# Patient Record
Sex: Male | Born: 1970 | Race: White | Hispanic: No | Marital: Married | Smoking: Former smoker
Health system: Southern US, Community
[De-identification: ages and names within clinical notes are randomized; demographics above are authoritative.]

## PROBLEM LIST (undated history)

## (undated) DIAGNOSIS — G4730 Sleep apnea, unspecified: Secondary | ICD-10-CM

## (undated) DIAGNOSIS — R011 Cardiac murmur, unspecified: Secondary | ICD-10-CM

## (undated) DIAGNOSIS — F32A Depression, unspecified: Secondary | ICD-10-CM

## (undated) DIAGNOSIS — I10 Essential (primary) hypertension: Secondary | ICD-10-CM

## (undated) DIAGNOSIS — M1990 Unspecified osteoarthritis, unspecified site: Secondary | ICD-10-CM

## (undated) HISTORY — PX: VARICOSE VEIN SURGERY: SHX832

## (undated) HISTORY — PX: EXTERNAL EAR SURGERY: SHX627

---

## 2005-09-20 ENCOUNTER — Encounter (INDEPENDENT_AMBULATORY_CARE_PROVIDER_SITE_OTHER): Payer: Self-pay | Admitting: *Deleted

## 2005-09-20 ENCOUNTER — Ambulatory Visit (HOSPITAL_COMMUNITY): Admission: RE | Admit: 2005-09-20 | Discharge: 2005-09-20 | Payer: Self-pay | Admitting: Otolaryngology

## 2008-05-23 ENCOUNTER — Ambulatory Visit (HOSPITAL_BASED_OUTPATIENT_CLINIC_OR_DEPARTMENT_OTHER): Admission: RE | Admit: 2008-05-23 | Discharge: 2008-05-23 | Payer: Self-pay | Admitting: Otolaryngology

## 2008-05-29 ENCOUNTER — Encounter: Payer: Self-pay | Admitting: Internal Medicine

## 2008-06-06 ENCOUNTER — Ambulatory Visit: Payer: Self-pay | Admitting: Internal Medicine

## 2008-06-16 ENCOUNTER — Encounter: Payer: Self-pay | Admitting: Internal Medicine

## 2008-07-08 ENCOUNTER — Ambulatory Visit: Payer: Self-pay | Admitting: Internal Medicine

## 2008-07-08 DIAGNOSIS — G4730 Sleep apnea, unspecified: Secondary | ICD-10-CM | POA: Insufficient documentation

## 2008-07-10 ENCOUNTER — Encounter: Payer: Self-pay | Admitting: Internal Medicine

## 2008-08-13 ENCOUNTER — Ambulatory Visit: Payer: Self-pay | Admitting: Internal Medicine

## 2010-10-28 NOTE — H&P (Signed)
NAMEMARCELLO, [name]                 ACCOUNT NO.:  [id number]   MEDICAL RECORD NO.:  [id number]          PATIENT TYPE:  AMB   LOCATION:  SDS                          FACILITY:  [facility]   PHYSICIAN:  Hermelinda Medicus, M.D.   DATE OF BIRTH:  Oct 05, 1970   DATE OF ADMISSION:  09/20/2005  DATE OF DISCHARGE:                                HISTORY & PHYSICAL   HISTORY OF PRESENT ILLNESS:  This patient is a 40 year old male who has had  ear issues, ear problems with infection and otitis externa and otitis media  for several years.  He saw me several years ago, and then came in just  recently, having first been seen with a very small area of infection in  2000.  He had a right otitis externa, but he improved on Augmentin and  Cipro.  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  He  has had persistent infections, and has seen the medical doctor recently, but  needs to have a right mastoid tympanoplasty.  He weighs 398, and he had a  complete evaluation by Dr. Lady Deutscher of [facility] and found  to be in excellent health.  His weight in high school was 285.  He, at one  time, weighed 450, and he weighs now 395.  His other ear, his left, is  clear.  Tympanic membrane looks excellent.  His oral cavity and nose are  clear.  His larynx is clear.  True cords, false cords, epiglottis, base of  tongue are free of any ulceration or mass.  His neck is free of any  thyromegaly, cervical adenopathy or mass.  His right ear just shows the  posterosuperior attic cholesteatoma.  His audiogram is quite reasonable,  showing a conductive hearing loss at the lower frequency and a mild  sensorineural hearing loss in the upper frequency, with some conductive loss  with a flat tympanogram on that right side.  He has 100% discrimination  score, and he has a 30 decibel speech reception threshold.  His chest is  clear.   No rales, rhonchi or wheezes.  Cardiovascular exam revealed no rubs,  murmurs or gallops.  Abdomen was unremarkable, except that he is extremely  obese at 395, and his legs are clear.  He has had vein strippings in the  past.   DIAGNOSIS:  1.  Right mastoiditis with cholesteatoma, tympanic membrane perforation,      superior posterior pars flaccida.  2.  Exogenous obesity.  3.  History of smoking, quit in 1994.  4.  Cardiac clearance within normal limits.  5.  History of gout.  6.  History of vein stripping.           ______________________________  Hermelinda Medicus, M.D.     JC/MEDQ  D:  09/20/2005  T:  09/20/2005  Job:  [id number]   cc:   [facility]  [place], [place]   Elmore Guise., M.D.  Fax: [phone]

## 2010-10-28 NOTE — Op Note (Signed)
[name], [name]                 ACCOUNT NO.:  [id number]   MEDICAL RECORD NO.:  [id number]          PATIENT TYPE:  AMB   LOCATION:  SDS                          FACILITY:  [facility]   PHYSICIAN:  Hermelinda Medicus, M.D.   DATE OF BIRTH:  April 06, 1971   DATE OF PROCEDURE:  DATE OF DISCHARGE:                                 OPERATIVE REPORT   PREOPERATIVE DIAGNOSES:  Right mastoiditis with attic cholesteatoma  posterior superior pars flaccida.   POSTOPERATIVE DIAGNOSES:  Right mastoiditis with attic cholesteatoma  posterior superior pars flaccida.   OPERATION:  Right mastoid tympanoplasty canal wall down.   SURGEON:  Dr. Hermelinda Medicus.   ANESTHESIA:  Dr. Burna Forts, general endotracheal.   PROCEDURE:  The patient was placed in the supine position. Under general  endotracheal anesthesia, the ear was prepped and draped using Betadine x3  and the usual head drape.  The prep was Betadine expressly in the external  ear canal and then the otologic drape was placed and the first approach was  via the external ear canal where we made a tympanotomy incision after  injecting the 4 quadrants with 1% Xylocaine with epinephrine. A considerable  amount of cholesteatomatous debris was removed from the sac immediately and  then the sac was separated from the good portion of the tympanic membrane  which represented about 70% of the tympanic membrane that we could save. The  superior posterior aspect however was including the head and long process of  the incus, posterior canal wall, the head of the malleus even anterior to  the malleus. This area was all taken and was reflected superiorly and a  considerable amount was removed just from this external ear canal exposure.  Once we exposed and removed as much as we could see through the external ear  canal, we then did a postauricular incision, took a temporalis fascia graft  and retracted the mastoid soft tissues anteriorly and posteriorly,  isolated  the external ear canal and we could see right down the ear canal. As we  began the mastoid, we realized that the superior aspect of the mastoid was  really quite narrow and the shadow of the middle cranial fossa dura could be  seen very close to the superior aspect of the ear canal.  We therefore took  down the posterior and superior canal as we worked down toward this quite  large cholesteatoma that extended up to be anterior to the head of the of  the malleus and involved the head of the malleus and the incus but did not  go into the facial recess region. We could see the facial nerve but we did  not need to take down the incus or remove the incus. All the ossicular chain  was intact.  The chorda tympani was also intact and we saved that. We  removed this cholesteatoma from the heads of the ossicular chain and from  this posterior aspect and anterior and were able to once we took down this  posterior and anterior superior canal wall, we could see the entire attic  and antrum region.  Once this was completed and all cholesteatoma was  removed, the remainder of the mastoid looked to be in quite good condition.  There were some cells with fluid but most of this was very sclerotic  mastoid. We then took the temporalis fascia graft, extended this underneath  the superior edge of the tympanic membrane and draped it up over the heads  of the malleus and up along the superior and posterior area of the new  enlarged canal and mastoid. Once this was achieved, we placed Gelfoam on  this region and then closed our mastoid dressing with a double layer of  chromic catgut and skin clips.  We then via the external ear canal placed  more Gelfoam, then  Neosporin ointment, then cotton, then a mastoid dressing and the patient was  taken to the recovery room in good condition.  The patient will be an  outpatient and we will see him again on Friday in 3 days for dressing  removal or change and then  in 1 week, 3 weeks, 6 weeks 3 months, 6 months  and a year.           ______________________________  Hermelinda Medicus, M.D.     JC/MEDQ  D:  09/20/2005  T:  09/20/2005  Job:  [id number]   cc:   [facility], [place], [place]   Elmore Guise., M.D.  Fax: [phone]

## 2010-10-28 NOTE — Procedures (Signed)
NAMEJAMYSON, [name]                 ACCOUNT NO.:  [id number]   MEDICAL RECORD NO.:  [id number]          PATIENT TYPE:  OUT   LOCATION:  SLEEP CENTER                 FACILITY:  [facility]   PHYSICIAN:  Clinton D. Maple Hudson, MD, FCCP, FACPDATE OF BIRTH:  Feb 19, 1969   DATE OF STUDY:                            NOCTURNAL POLYSOMNOGRAM   REFERRING PHYSICIAN:  Hermelinda Medicus, M.D.   INDICATION FOR STUDY:  Hypersomnia with sleep apnea.   EPWORTH SLEEPINESS SCORE:  Epworth sleepiness score 6/24.  BMI 53.6.  Weight 395 pounds.  Height 72 inches.  Neck 21 inches.   MEDICATIONS:  No home medications were listed.   SLEEP ARCHITECTURE:  Split study protocol triggered by emergency center  protocol.  During the diagnostic phase total sleep time 134.5 minutes  with sleep efficiency 83%.  Stage I was 0.4%.  Stage II 94.8%.  Stage  III absent.  REM 4.8% of total sleep time.  Sleep latency 10.5 minutes.  REM latency 128.5 minutes.  Awake after sleep onset 0.5 minutes.  Arousal index 7.6.  No bedtime medication was taken.   RESPIRATORY DATA:  Split study protocol.  During the initial diagnostic  phase, a total of 268 events was recorded including 1 obstructive apnea  and 267 hypopneas.  All events were recorded as nonsupine.  REM AHI  73.8.  CPAP was initiated by the technician per protocol because of  severe apnea with desaturation and titrated to 23 CWP, AHI 0 per hour.  He chose a medium ResMed full face Quattro mask with heated humidifier.   OXYGEN DATA:  Very loud snoring before CPAP with oxygen desaturation to  a nadir of 60% and mean oxygen saturation before CPAP of 84.8% on room  air.  After CPAP control, mean oxygen saturation held 93.3% on room air.   CARDIAC DATA:  Normal sinus rhythm.   MOVEMENT-PARASOMNIA:  No significant movement disturbance.  Bathroom x1.   IMPRESSIONS-RECOMMENDATIONS:  1. Severe obstructive sleep apnea/hypopnea syndrome, apnea-hypopnea      index 119.6 per hour with most  events hypopneas recorded while      supine.  Very loud snoring with oxygen desaturation with to a nadir      of 60% on room air.  2. Following laboratory protocol for severe apnea, CPAP was initiated      and titrated to 23 CWP, AHI 0 per hour.      The patient chose a medium ResMed full face Quattro mask with      heated humidifier.  3. Other treatments may be appropriate depending on clinical context.      Clinton D. Maple Hudson, MD, [facility], FACP  Diplomate, Biomedical engineer of Sleep Medicine  Electronically Signed     CDY/MEDQ  D:  05/30/2008 11:55:49  T:  05/31/2008 16:10:96  Job:  [id number]

## 2018-07-31 ENCOUNTER — Other Ambulatory Visit: Payer: Self-pay

## 2018-07-31 DIAGNOSIS — I83893 Varicose veins of bilateral lower extremities with other complications: Secondary | ICD-10-CM

## 2018-08-05 ENCOUNTER — Other Ambulatory Visit: Payer: Self-pay

## 2018-08-05 ENCOUNTER — Encounter: Payer: Self-pay | Admitting: Surgery

## 2018-08-05 ENCOUNTER — Ambulatory Visit (HOSPITAL_COMMUNITY)
Admission: RE | Admit: 2018-08-05 | Discharge: 2018-08-05 | Disposition: A | Discharge status: Home / Self Care | Payer: 59 | Source: Ambulatory Visit | Attending: Family | Admitting: Family

## 2018-08-05 ENCOUNTER — Ambulatory Visit (INDEPENDENT_AMBULATORY_CARE_PROVIDER_SITE_OTHER): Payer: 59 | Admitting: Surgery

## 2018-08-05 VITALS — BP 121/82 | HR 50 | Temp 98.0°F | Resp 20 | Ht 72.0 in | Wt 301.1 lb

## 2018-08-05 DIAGNOSIS — I83893 Varicose veins of bilateral lower extremities with other complications: Secondary | ICD-10-CM | POA: Diagnosis present

## 2018-08-05 NOTE — Progress Notes (Signed)
[facility] [place]  Patient name: [name] MRN: [id number] DOB: 21-Nov-1970 Sex: male   REQUESTING PROVIDER:    Cletis Athens   REASON FOR CONSULT:    Varicose veins  HISTORY OF PRESENT ILLNESS:   [name] is a 48 y.o. male, who is referred today for evaluation of bilateral leg varicose veins.  The patient states that approximately 20 years ago he underwent left leg vein stripping by Dr. Guinevere Scarlet.  He has been wearing knee-high compression stockings daily.  About 3 weeks ago he developed severe pain and swelling over 1 of the varicosities in his left upper thigh.  He was treated with antibiotics.  This has significantly improved.  Patient does have a history of a ulcer on his right leg which healed.  He states that he has noticed skin color changes around both ankles for many years.  He denies any history of DVT.  PAST MEDICAL HISTORY    Thrombophlebitis   FAMILY HISTORY   Negative for DVT  SOCIAL HISTORY:   Social History   Socioeconomic History  . Marital status: Married    Spouse name: Not on file  . Number of children: Not on file  . Years of education: Not on file  . Highest education level: Not on file  Occupational History  . Not on file  Social Needs  . Financial resource strain: Not on file  . Food insecurity:    Worry: Not on file    Inability: Not on file  . Transportation needs:    Medical: Not on file    Non-medical: Not on file  Tobacco Use  . Smoking status: Never Smoker  . Smokeless tobacco: Current User    Types: Chew  Substance and Sexual Activity  . Alcohol use: Yes    Comment: occasional  . Drug use: Not Currently  . Sexual activity: Not on file  Lifestyle  . Physical activity:    Days per week: Not on file    Minutes per session: Not on file  . Stress: Not on file  Relationships  . Social connections:    Talks on phone: Not on file    Gets together: Not on file   Attends religious service: Not on file    Active member of club or organization: Not on file    Attends meetings of clubs or organizations: Not on file    Relationship status: Not on file  . Intimate partner violence:    Fear of current or ex partner: Not on file    Emotionally abused: Not on file    Physically abused: Not on file    Forced sexual activity: Not on file  Other Topics Concern  . Not on file  Social History Narrative  . Not on file    ALLERGIES:    No Known Allergies  CURRENT MEDICATIONS:    No current outpatient medications on file.   No current facility-administered medications for this visit.     REVIEW OF SYSTEMS:    denotes positive finding,  denotes negative finding Cardiac  Comments:  Chest pain or chest pressure:    Shortness of breath upon exertion:    Short of breath when lying flat:    Irregular heart rhythm:        Vascular    Pain in calf, thigh, or hip brought on by ambulation: x   Pain in feet at night that wakes you up from your sleep:  Blood clot in your veins:    Leg swelling:  x       Pulmonary    Oxygen at home:    Productive cough:     Wheezing:         Neurologic    Sudden weakness in arms or legs:     Sudden numbness in arms or legs:     Sudden onset of difficulty speaking or slurred speech:    Temporary loss of vision in one eye:     Problems with dizziness:         Gastrointestinal    Blood in stool:      Vomited blood:         Genitourinary    Burning when urinating:     Blood in urine:        Psychiatric    Major depression:         Hematologic    Bleeding problems:    Problems with blood clotting too easily:        Skin    Rashes or ulcers:        Constitutional    Fever or chills:     PHYSICAL EXAM:   Vitals:   08/05/18 1008  BP: 121/82  Pulse: (!) 50  Resp: 20  Temp: 98 F (36.7 C)  SpO2: 100%  Weight: (!) 301 lb 2.4 oz (136.6 kg)  Height: 6' (1.829 m)    GENERAL: The  patient is a well-nourished male, in no acute distress. The vital signs are documented above. CARDIAC: There is a regular rate and rhythm.  VASCULAR: Extensive ropelike varicosities bilaterally.  The area of concern on the anterior left thigh appears to be an area of thrombophlebitis.  There is no longer any inflammation or erythema to suggest infection. PULMONARY: Nonlabored respirations ABDOMEN: Soft and non-tender with normal pitched bowel sounds.  MUSCULOSKELETAL: There are no major deformities or cyanosis. NEUROLOGIC: No focal weakness or paresthesias are detected. SKIN: Hyperpigmentation bilateral lower extremity with evidence of healed ulcers bilaterally PSYCHIATRIC: The patient has a normal affect.  STUDIES:   I have ordered and reviewed his vascular studies with the following findings:    Venous Reflux Times Normal value < 0.5 sec +------------------------------+----------+---------+                               Right (ms)Left (ms) +------------------------------+----------+---------+ CFV                           3789.00   1973.00   +------------------------------+----------+---------+ FV                            3308.00   1276.00   +------------------------------+----------+---------+ Popliteal                     1027.00   1078.00   +------------------------------+----------+---------+ GSV at Saphenofemoral junction3675.00   2105.00   +------------------------------+----------+---------+ GSV prox thigh                3367.00             +------------------------------+----------+---------+ GSV mid thigh                 2993.00             +------------------------------+----------+---------+ GSV dist thigh  3763.00             +------------------------------+----------+---------+ GSV at knee                   2919.00             +------------------------------+----------+---------+ GSV prox calf                  2288.00             +------------------------------+----------+---------+ GSV mid calf                  3029.00             +------------------------------+----------+---------+ SSV prox                                3777.00   +------------------------------+----------+---------+ SSV mid                                 4423.00   +------------------------------+----------+---------+  Vein Diameters: +------------------------------+----------+---------+                               Right (cm)Left (cm) +------------------------------+----------+---------+ GSV at Saphenofemoral junction1.41      1.20      +------------------------------+----------+---------+ GSV at prox thigh             1.15      -         +------------------------------+----------+---------+ GSV at mid thigh              1.13      -         +------------------------------+----------+---------+ GSV at distal thigh           .89       -         +------------------------------+----------+---------+ GSV at knee                   .97       -         +------------------------------+----------+---------+ GSV prox calf                 1.27      -         +------------------------------+----------+---------+ GSV mid calf                  .52       -         +------------------------------+----------+---------+ SSV origin                    .33       .23       +------------------------------+----------+---------+ SSV prox                      .36       thrombus  +------------------------------+----------+---------+ SSV mid                       .59       .27       +------------------------------+----------+---------+      ASSESSMENT and PLAN   Extensive bilateral venous reflux with varicosities.  The patient has had what sounds like an episode of thrombophlebitis on his left thigh.  This has significantly improved.  It is  almost resolved.  I have encouraged him  to use warm compresses and ibuprofen to help with his symptoms.  Today's ultrasound shows no evidence of a left saphenous vein consistent with his prior vein stripping.  He has significant reflux in the right saphenous vein.  The patient is not concerned about any cosmetic issues.  He does wear his compression stockings religiously.  I told him that he would be a candidate for laser ablation of the right leg as well as stab phlebectomy of his numerous bilateral varicosities if he so desires.  At this time, he is content to just wear his compression stockings.  He will contact us if this changes.   Durene Cal, MD [facility] [phone] Pager [phone]

## 2018-08-07 ENCOUNTER — Encounter: Payer: Self-pay | Admitting: Nurse Practitioner

## 2021-01-02 ENCOUNTER — Emergency Department (HOSPITAL_COMMUNITY): Payer: Self-pay

## 2021-01-02 ENCOUNTER — Encounter (HOSPITAL_COMMUNITY): Payer: Self-pay | Admitting: Emergency Medicine

## 2021-01-02 ENCOUNTER — Other Ambulatory Visit: Payer: Self-pay

## 2021-01-02 ENCOUNTER — Emergency Department (HOSPITAL_COMMUNITY)
Admission: EM | Admit: 2021-01-02 | Discharge: 2021-01-02 | Disposition: A | Discharge status: Home / Self Care | Payer: Self-pay | Attending: Emergency Medicine | Admitting: Emergency Medicine

## 2021-01-02 DIAGNOSIS — R202 Paresthesia of skin: Secondary | ICD-10-CM | POA: Insufficient documentation

## 2021-01-02 DIAGNOSIS — S3992XA Unspecified injury of lower back, initial encounter: Secondary | ICD-10-CM | POA: Insufficient documentation

## 2021-01-02 DIAGNOSIS — I10 Essential (primary) hypertension: Secondary | ICD-10-CM | POA: Insufficient documentation

## 2021-01-02 DIAGNOSIS — F17220 Nicotine dependence, chewing tobacco, uncomplicated: Secondary | ICD-10-CM | POA: Insufficient documentation

## 2021-01-02 DIAGNOSIS — W1789XA Other fall from one level to another, initial encounter: Secondary | ICD-10-CM | POA: Insufficient documentation

## 2021-01-02 HISTORY — DX: Essential (primary) hypertension: I10

## 2021-01-02 LAB — COMPREHENSIVE METABOLIC PANEL
ALT: 32 U/L (ref 0–44)
AST: 34 U/L (ref 15–41)
Albumin: 4.7 g/dL (ref 3.5–5.0)
Alkaline Phosphatase: 49 U/L (ref 38–126)
Anion gap: 10 (ref 5–15)
BUN: 19 mg/dL (ref 6–20)
CO2: 26 mmol/L (ref 22–32)
Calcium: 9.7 mg/dL (ref 8.9–10.3)
Chloride: 99 mmol/L (ref 98–111)
Creatinine, Ser: 0.77 mg/dL (ref 0.61–1.24)
GFR, Estimated: >60 mL/min (ref >60)
Glucose, Bld: 94 mg/dL (ref 70–99)
Potassium: 4.5 mmol/L (ref 3.5–5.1)
Sodium: 135 mmol/L (ref 135–145)
Total Bilirubin: 1.1 mg/dL (ref 0.3–1.2)
Total Protein: 7.8 g/dL (ref 6.5–8.1)

## 2021-01-02 LAB — CBC WITH DIFFERENTIAL/PLATELET
Abs Immature Granulocytes: 0.04 10*3/uL (ref 0.00–0.07)
Basophils Absolute: 0 10*3/uL (ref 0.0–0.1)
Basophils Relative: 0 %
Eosinophils Absolute: 0 10*3/uL (ref 0.0–0.5)
Eosinophils Relative: 0 %
HCT: 49.6 % (ref 39.0–52.0)
Hemoglobin: 16.8 g/dL (ref 13.0–17.0)
Immature Granulocytes: 0 %
Lymphocytes Relative: 16 %
Lymphs Abs: 1.7 10*3/uL (ref 0.7–4.0)
MCH: 32.2 pg (ref 26.0–34.0)
MCHC: 33.9 g/dL (ref 30.0–36.0)
MCV: 95.2 fL (ref 80.0–100.0)
Monocytes Absolute: 0.6 10*3/uL (ref 0.1–1.0)
Monocytes Relative: 6 %
Neutro Abs: 8.1 10*3/uL — ABNORMAL HIGH (ref 1.7–7.7)
Neutrophils Relative %: 78 %
Platelets: 198 10*3/uL (ref 150–400)
RBC: 5.21 MIL/uL (ref 4.22–5.81)
RDW: 13.7 % (ref 11.5–15.5)
WBC: 10.5 10*3/uL (ref 4.0–10.5)
nRBC: 0 % (ref 0.0–0.2)

## 2021-01-02 MED ORDER — OXYCODONE-ACETAMINOPHEN 5-325 MG PO TABS: 2.0000 | ORAL_TABLET | Freq: Four times a day (QID) | ORAL | 0 refills | Status: AC | PRN

## 2021-01-02 MED ORDER — FENTANYL CITRATE (PF) 100 MCG/2ML IJ SOLN
100.0000 ug | Freq: Once | INTRAMUSCULAR | Status: AC
  Administered 2021-01-02: 100 ug via INTRAVENOUS
  Filled 2021-01-02: qty 2

## 2021-01-02 MED ORDER — HYDROMORPHONE HCL 1 MG/ML IJ SOLN
1.0000 mg | Freq: Once | INTRAMUSCULAR | Status: AC
  Administered 2021-01-02: 1 mg via INTRAVENOUS
  Filled 2021-01-02: qty 1

## 2021-01-02 NOTE — Discharge Instructions (Addendum)
  You were evaluated in the Emergency Department and after careful evaluation, we did not find any emergent condition requiring admission or further testing in the hospital.   Your exam/testing today was overall reassuring.  Symptoms seem to be due to your spinal stenosis in your back as we discussed.  I did attach your MRI readings.  You do not need emergent surgery, however I do want you to see the spine specialist as we discussed.  I want you to take ibuprofen or Tylenol as directed on the bottle for pain, make sure to schedule this.  When the pain is severe you can take Percocet.  Do not this is a narcotic and can make you fall and become drowsy, do not drink alcohol or take other sedative medications when taking this medication.  If you have any other falls, or have any new or worsening concerning symptom please come back to the emergency department.  Make sure to use your walker and follow-up with your primary care soon as possible. Please return to the Emergency Department if you experience any worsening of your condition.  Thank you for allowing [place] to be a part of your care. Please speak to your pharmacist about any new medications prescribed today in regards to side effects or interactions with other medications.    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

## 2021-01-02 NOTE — ED Notes (Signed)
Called for triage x1, no response, not visualized in lobby.

## 2021-01-02 NOTE — ED Provider Notes (Signed)
[facility] Provider Note   CSN: [id number] Arrival date & time: 01/02/21  1610     History Chief Complaint  Patient presents with   Fall   Extremity Weakness    [name] is a 50 y.o. male with past medical history of morbid obesity and hypertension that presents emergency department today for a fall 5 weeks ago.  States that he fell off a 3 foot trailer, landing primarily on his right hip and back.  Patient did not see anyone for this until 2 weeks later when he started having severe back pain, states that he went to [facility] and they obtained an x-ray of that hip, he states that there was no fractures, was sent home.  Patient states then about 3 weeks ago he started having severe pain to his left hip and lower extremity.  States that he did not fall again at this time, however felt as if he was compensating on his left side due to the amount of limping he was doing on his right side.  Patient states then a couple weeks ago he started having weakness to the left side, progressively worsening to the point where now he is dragging his foot on his left side.  Patient states that he has fell multiple times due to this foot dragging, states that he fell 4 times today.  Patient did not hit his head when he initially fell 5 weeks ago, however when he fell yesterday he did hit his head.  Did not lose consciousness.  Is not having any upper extremity weakness or paresthesias.  Patient states that he does have some paresthesias in his left lower extremity.  Denies any groin paresthesias.  Denies any IV drug use, fevers, prior back surgery, history of cancer, urinary or stool incontinence or retention.  Patient is not on a blood thinner.  States that he has not been able to ambulate for the past week due to his foot drag.  Has not been taking anything for this.  Denies pain elsewhere.  No chest pain or shortness of breath.  No abdominal pain.  No nausea or  vomiting.  Denies any substance use.Marland Kitchen.  HPI     Past Medical History:  Diagnosis Date   Hypertension     Patient Active Problem List   Diagnosis Date Noted   MORBID OBESITY 07/08/2008   SLEEP APNEA 07/08/2008    Past Surgical History:  Procedure Laterality Date   EXTERNAL EAR SURGERY     VARICOSE VEIN SURGERY Left        History reviewed. No pertinent family history.  Social History   Tobacco Use   Smoking status: Never   Smokeless tobacco: Current    Types: Chew  Vaping Use   Vaping Use: Never used  Substance Use Topics   Alcohol use: Yes    Comment: occasional   Drug use: Not Currently    Home Medications Prior to Admission medications   Medication Sig Start Date End Date Taking? Authorizing Provider  oxyCODONE-acetaminophen (PERCOCET/ROXICET) 5-325 MG tablet Take 2 tablets by mouth every 6 (six) hours as needed for up to 3 days for severe pain. 01/02/21 01/05/21 Yes Farrel GordonPatel, Daniil Labarge, PA-C    Allergies    Patient has no known allergies.  Review of Systems   Review of Systems  Constitutional:  Negative for chills, diaphoresis, fatigue and fever.  HENT:  Negative for congestion, sore throat and trouble swallowing.   Eyes:  Negative  for pain and visual disturbance.  Respiratory:  Negative for cough, shortness of breath and wheezing.   Cardiovascular:  Negative for chest pain, palpitations and leg swelling.  Gastrointestinal:  Negative for abdominal distention, abdominal pain, diarrhea, nausea and vomiting.  Genitourinary:  Negative for difficulty urinating.  Musculoskeletal:  Positive for arthralgias. Negative for back pain, neck pain and neck stiffness.  Skin:  Negative for pallor.  Neurological:  Negative for dizziness, speech difficulty, weakness and headaches.  Psychiatric/Behavioral:  Negative for confusion.    Physical Exam Updated Vital Signs BP 136/90   Pulse 68   Temp 98 F (36.7 C) (Oral)   Resp 18   SpO2 100%   Physical  Exam Constitutional:      General: He is in acute distress.     Appearance: Normal appearance. He is not ill-appearing, toxic-appearing or diaphoretic.  HENT:     Mouth/Throat:     Mouth: Mucous membranes are moist.     Pharynx: Oropharynx is clear.  Eyes:     General: No scleral icterus.    Extraocular Movements: Extraocular movements intact.     Pupils: Pupils are equal, round, and reactive to light.  Cardiovascular:     Rate and Rhythm: Normal rate and regular rhythm.     Pulses: Normal pulses.     Heart sounds: Normal heart sounds.  Pulmonary:     Effort: Pulmonary effort is normal. No respiratory distress.     Breath sounds: Normal breath sounds. No stridor. No wheezing, rhonchi or rales.  Chest:     Chest wall: No tenderness.  Abdominal:     General: Abdomen is flat. There is no distension.     Palpations: Abdomen is soft.     Tenderness: There is no abdominal tenderness. There is no guarding or rebound.  Genitourinary:    Rectum: Guaiac result negative.  Musculoskeletal:        General: No swelling or tenderness. Normal range of motion.     Cervical back: Normal range of motion and neck supple. No rigidity.     Right lower leg: No edema.     Left lower leg: No edema.     Comments: Good strength to L hip with good extension and flexion to knee.  DP pulse 2+ bilaterally.  Upon ambulation patient does drag left foot behind him.  Skin:    General: Skin is warm and dry.     Capillary Refill: Capillary refill takes less than 2 seconds.     Coloration: Skin is not pale.  Neurological:     General: No focal deficit present.     Mental Status: He is alert and oriented to person, place, and time.     Comments: Alert. Clear speech. No facial droop. CNIII-XII grossly intact. Bilateral upper and lower extremities' sensation grossly intact. 5/5 symmetric strength with grip strength and with plantar and dorsi flexion bilaterally. . Normal finger to nose bilaterally. Negative pronator  drift.     Psychiatric:        Mood and Affect: Mood normal.        Behavior: Behavior normal.    ED Results / Procedures / Treatments   Labs (all labs ordered are listed, but only abnormal results are displayed) Labs Reviewed  CBC WITH DIFFERENTIAL/PLATELET - Abnormal; Notable for the following components:      Result Value   Neutro Abs 8.1 (*)    All other components within normal limits  COMPREHENSIVE METABOLIC PANEL    EKG  None  Radiology CT Head Wo Contrast  Result Date: 01/02/2021 CLINICAL DATA:  Head trauma, moderate to severe. Patient fell off of a trailer weeks ago. Now left leg will not work. Multiple falls today. Numbness and tingling in hands and legs. EXAM: CT HEAD WITHOUT CONTRAST TECHNIQUE: Contiguous axial images were obtained from the base of the skull through the vertex without intravenous contrast. COMPARISON:  None. FINDINGS: Brain: No evidence of acute infarction, hemorrhage, hydrocephalus, extra-axial collection or mass lesion/mass effect. Vascular: No hyperdense vessel or unexpected calcification. Skull: Normal. Negative for fracture or focal lesion. Sinuses/Orbits: Small retention cyst in the right maxillary antrum. Paranasal sinuses are otherwise clear. Opacification of some of the right mastoid air cells and in the right middle ear. Other: None. IMPRESSION: 1. No acute intracranial abnormalities. 2. Opacification of right mastoid air cells and right middle ear. Changes could represent effusions or inflammation. Electronically Signed   By: Burman Nieves M.D.   On: 01/02/2021 18:28   MR LUMBAR SPINE WO CONTRAST  Result Date: 01/02/2021 CLINICAL DATA:  Low back pain, cauda equina syndrome suspected. Multiple recent falls. Numbness and tingling in the hands and legs. Limping. EXAM: MRI LUMBAR SPINE WITHOUT CONTRAST TECHNIQUE: Multiplanar, multisequence MR imaging of the lumbar spine was performed. No intravenous contrast was administered. COMPARISON:  None.  FINDINGS: Segmentation: Normal lumbar segmentation is assumed with the lowest fully formed disc space designated L5-S1. Alignment:  Normal. Vertebrae: No fracture or suspicious marrow lesion. Small Schmorl's nodes in the lower thoracic spine. Mild left-sided endplate edema at U0-4, likely degenerative. Mild bilateral L4-5 facet edema. Conus medullaris and cauda equina: Conus extends to the L1-2 level. Conus and cauda equina appear normal. Paraspinal and other soft tissues: Mild edema in the soft tissues about the L4-5 facets bilaterally. Disc levels: Disc desiccation greatest at T12-L1, L4-5, and L5-S1. Largely preserved disc space heights. T11-12: Only imaged sagittally. Small left paracentral disc protrusion slightly deforming the spinal cord. Mild facet arthrosis. No high-grade spinal canal or neural foraminal stenosis. T12-L1: Shallow left paracentral disc protrusion and mild facet arthrosis without stenosis. L1-2: Negative. L2-3: Mild disc bulging and moderate facet arthrosis without stenosis. L3-4: Disc bulging, congenitally short pedicles, and moderate facet arthrosis result in borderline spinal stenosis, mild right lateral recess stenosis, and mild bilateral neural foraminal stenosis. L4-5: Circumferential disc bulging, congenitally short pedicles, and severe facet and ligamentum flavum hypertrophy result in severe spinal stenosis (5 mm AP spinal canal diameter) and moderate to severe bilateral neural foraminal stenosis. L5-S1: Minimal disc bulging and severe facet hypertrophy result in mild bilateral lateral recess stenosis and mild right and moderate left neural foraminal stenosis without spinal stenosis. IMPRESSION: Multilevel lumbar disc and facet degeneration, worst at L4-5 where there is severe spinal stenosis and moderate to severe bilateral neural foraminal stenosis. Electronically Signed   By: Sebastian Ache M.D.   On: 01/02/2021 19:35   DG Hip Unilat With Pelvis 2-3 Views Left  Result Date:  01/02/2021 CLINICAL DATA:  Fall. Larey Seat off of a trailer on to the right hip weeks ago. Limping. Now left leg does not work. Numbness and tingling to the hands and legs. EXAM: DG HIP (WITH OR WITHOUT PELVIS) 2-3V LEFT COMPARISON:  None. FINDINGS: Mild degenerative changes in the lower lumbar spine and hips. Degenerative changes are greatest on the left hip. No evidence of acute fracture or dislocation. No focal bone lesion or bone destruction. Bone cortex appears intact. SI joints and symphysis pubis are not displaced. Soft tissues are  unremarkable. IMPRESSION: Degenerative changes in the left hip. No acute displaced fractures identified. Electronically Signed   By: Burman Nieves M.D.   On: 01/02/2021 18:29    Procedures Procedures   Medications Ordered in ED Medications  fentaNYL (SUBLIMAZE) injection 100 mcg (100 mcg Intravenous Given 01/02/21 1753)  HYDROmorphone (DILAUDID) injection 1 mg (1 mg Intravenous Given 01/02/21 2113)    ED Course  I have reviewed the triage vital signs and the nursing notes.  Pertinent labs & imaging results that were available during my care of the patient were reviewed by me and considered in my medical decision making (see chart for details).    MDM Rules/Calculators/A&P                            BEAUFORD LANDO is a 50 y.o. male with past medical history of morbid obesity and hypertension that presents emerged department today for a mechanical fall 5 weeks ago.  I suspect that patient has injury to lumbar spine causing his foot drop and paresthesias in his left side in addition to his foot drag.  Otherwise patient has a normal neuro exam, no signs of acute/subacute stroke with no facial droop, aphasia, no upper extremity weakness/sensation changes, normal pronator drift, does not actually have weakness on the left lower extremity on my exam, however does drag his foot.  I think this is secondary to pain.  Will obtain MRI.  Initially patient did not hit his head  when he fell 5 weeks ago, however did not hit his head today will obtain CT head due to this and since patient keeps falling.  Work-up today unremarkable with MRI showing spinal stenosis.  Shared decision making about admission with patient due to patient's recurrent falls with foot drag, however patient does not want to be admitted at this time, states that he would rather control pain at home and follow-up with PCP in neurospecialist.  I am agreeable with this plan, patient is able to ambulate with crutches, states that he does have a walker at home which he will start to use.  Will give short course of narcotics, pain management discussed with patient, he states that he feels much better than when he came in.  Repeat normal neuro exam, patient to be discharged at this time.  Doubt need for further emergent work up at this time. I explained the diagnosis and have given explicit precautions to return to the ER including for any other new or worsening symptoms. The patient understands and accepts the medical plan as it's been dictated and I have answered their questions. Discharge instructions concerning home care and prescriptions have been given. The patient is STABLE and is discharged to home in good condition.  I discussed this case with my attending physician who cosigned this note including patient's presenting symptoms, physical exam, and planned diagnostics and interventions. Attending physician stated agreement with plan or made changes to plan which were implemented.    Final Clinical Impression(s) / ED Diagnoses Final diagnoses:  Injury of back, initial encounter    Rx / DC Orders ED Discharge Orders          Ordered    oxyCODONE-acetaminophen (PERCOCET/ROXICET) 5-325 MG tablet  Every 6 hours PRN        01/02/21 2224             Farrel Gordon, PA-C 01/02/21 2241    Gwyneth Sprout, MD 01/02/21 [phone]

## 2021-01-02 NOTE — ED Triage Notes (Addendum)
Patient fell off a trailer onto his right hip weeks ago. He has been limping for a few weeks. Now he says his left leg "won't work". He has fallen 3 times today alone. He reports numbness and tingling in the hands and legs.

## 2021-01-17 ENCOUNTER — Other Ambulatory Visit: Payer: Self-pay | Admitting: Neurosurgery

## 2021-01-18 ENCOUNTER — Other Ambulatory Visit: Payer: Self-pay

## 2021-01-18 ENCOUNTER — Encounter (HOSPITAL_COMMUNITY): Payer: Self-pay | Admitting: Neurosurgery

## 2021-01-18 NOTE — Progress Notes (Signed)
Anesthesia Chart Review: SAME DAY WORK-UP   Case: [id number] Date/Time: 01/19/21 0815   Procedure: ACDF C56, C67 - 3C   Anesthesia type: General   Pre-op diagnosis: HERNIATED NUCLEUS PULPOSUS WITH MYELOPATHY, CERVICAL   Location: [facility] OR ROOM 20 / [facility] OR   Surgeons: Lisbeth Renshaw, MD       DISCUSSION: Patient is a 50 year old male scheduled for the above procedure.  ED visit 01/02/21 following history of fall from a 3 foot trailer 5 weeks prior. He developed worsening back pain and then weakness on the left to the point of "dragging his [left] foot". He had had recurrent falls due to foot drop, the last on 01/01/21 and hit his head without LOC. No acute abnormalities on head CT. Lumbar spine MRI showed multi-level DDD, worse at L4-5 where there was severe spinal stenosis and moderate-severe bilateral neural foraminal stenosis. ED provider discussed consideration of admission for further work-up due to falls with foot drag, but patient declined admission and opted for pain management and out-patient follow-up. Since then patient had an MRI of the C/T-spines 01/14/21 showing extremely large disc extrusion at C6-7 resulting in markedly severe spinal stenosis and cord compression, large disc extrusion and severe spinal stenosis at C5-6, and questionable spinal cord signal abnormality at C5-6 and C6-7, edema or myelomalacia versus artifact. Neurosurgeon recommended that above procedure.  History includes former smoker (quit 06/13/95), HTN, murmur, varicose veins (s/p LLE vein stripping > 15 years ago), OSA (severe 2009), obesity.   He reported a recent echo at [facility] in [place] ordered through his PCP. He said that the echo was ordered after he  reported chest pressure when lying in bed that subsided after walking around for 10 minutes. Chest pressure stopped after he began sleeping in a recliner. He also reported history of murmur many years ago. Records received from [facility] and from  [facility]. EKG showed SB at 56 bpm, prolonged QT 05/19/20. Echo last month showed likely normal EF (only able to estimate), mild LVH, no significant valvular disease but echo was "very limited". Primary care notes from 12/28/20 indicate chest symptoms were associated with numbness and tingling when lying down. He denied chest pain and SOB at rest per PAT RN phone interview.     Patient with cervical myelopathy and foot left foot drop. He is a same day work-up, so anesthesia team to evaluate on the day of surgery. He had CBC and CMET on 01/02/21 and EKG on 05/19/20.   VS: Ht 6' (1.829 m)   Wt (!) 142.9 kg   BMI 42.72 kg/m  BP 136/90 01/02/21.     PROVIDERSMauricio Po, FNP is PCP [facility])    LABS: Lab results as of 01/02/21 include: Lab Results  Component Value Date   WBC 10.5 01/02/2021   HGB 16.8 01/02/2021   HCT 49.6 01/02/2021   PLT 198 01/02/2021   GLUCOSE 94 01/02/2021   ALT 32 01/02/2021   AST 34 01/02/2021   NA 135 01/02/2021   K 4.5 01/02/2021   CL 99 01/02/2021   CREATININE 0.77 01/02/2021   BUN 19 01/02/2021   CO2 26 01/02/2021     Sleep Study 05/30/08: IMPRESSIONS-RECOMMENDATIONS: Severe obstructive sleep apnea/hypopnea syndrome, apnea-hypopnea index 119.6 per hour with most events hypopneas recorded while supine.  Very loud snoring with oxygen desaturation with to a nadir of 60% on room air...   IMAGES: MRI C-spine 01/14/21 ([facility]): IMPRESSION: 1. Extremely large disc extrusion at C6-7  resulting in markedly severe spinal stenosis and cord compression. 2. Large disc extrusion and severe spinal stenosis at C5-6. 3. Questionable spinal cord signal abnormality at C5-6 and C6-7, edema or myelomalacia versus artifact.  MRI T-spine 01/14/21 ([facility]): IMPRESSION: 1. Motion degraded examination. 2. Small disc herniations at T5-6 and T11-12 with mild cord flattening. No significant stenosis or definite cord  signal abnormality.  MRI L-spine 01/02/21: IMPRESSION: Multilevel lumbar disc and facet degeneration, worst at L4-5 where there is severe spinal stenosis and moderate to severe bilateral neural foraminal stenosis.   EKG: 05/19/20 x2 (PCP): Sinus bradycardia at 56 bpm.  Horizontal axis.  Prolonged QT interval (QT 500-506 ms, QTc 488-495 ms)   CV: Echo 12/30/20 ([facility]): Impression: 1.  Technically very limited study. 2.  Dilated aortic root at 3.9 cm. 3.  Dilated left atrium (4.7 cm) and ventricle.  Interventricular septum 1.2 cm.  LV posterior wall 1.3 cm. 4.  Mild left ventricular hypertrophy. 5.  Grossly unremarkable left ventricular function.  Left ventricular systolic function appears grossly normal; however, an accurate assessment of ejection fraction is not possible.  Estimated EF 55-60%. 6.  If clinically requiring accurate assessment of ejection fraction, recommend repeating with attention to parasternal short axis views. 7.  There are no grossly stenotic or regurgitant valve; however, Doppler was limited and the pulmonic valve was not interrogated. 8.  The diastolic Doppler mitral inflow patterns are within normal limits.   Past Medical History:  Diagnosis Date   Arthritis    Depression    Heart murmur    Hypertension    Sleep apnea     Past Surgical History:  Procedure Laterality Date   EXTERNAL EAR SURGERY     VARICOSE VEIN SURGERY Left     MEDICATIONS: No current facility-administered medications for this encounter.    losartan (COZAAR) 25 MG tablet   diclofenac (CATAFLAM) 50 MG tablet   gabapentin (NEURONTIN) 600 MG tablet    Shonna Chock, PA-C Surgical Short Stay/Anesthesiology [facility] Phone [phone] [facility] Phone [phone] 01/18/2021 4:44 PM

## 2021-01-18 NOTE — Anesthesia Preprocedure Evaluation (Addendum)
Anesthesia Evaluation  Patient identified by MRN, date of birth, ID band Patient awake    Reviewed: Allergy & Precautions, NPO status , Patient's Chart, lab work & pertinent test results  Airway Mallampati: III  TM Distance: >3 FB Neck ROM: Limited    Dental  (+) Teeth Intact, Dental Advisory Given, Chipped,    Pulmonary sleep apnea , former smoker,    Pulmonary exam normal breath sounds clear to auscultation       Cardiovascular hypertension, Pt. on medications Normal cardiovascular exam Rhythm:Regular Rate:Normal     Neuro/Psych PSYCHIATRIC DISORDERS Depression HERNIATED NUCLEUS PULPOSUS WITH MYELOPATHY, CERVICAL    GI/Hepatic negative GI ROS, (+)     substance abuse  alcohol use and marijuana use,   Endo/Other  Morbid obesity  Renal/GU negative Renal ROS     Musculoskeletal  (+) Arthritis ,   Abdominal   Peds  Hematology negative hematology ROS (+)   Anesthesia Other Findings   Reproductive/Obstetrics                           Anesthesia Physical Anesthesia Plan  ASA: 3  Anesthesia Plan: General   Post-op Pain Management:    Induction: Intravenous  PONV Risk Score and Plan: 3 and Midazolam, Dexamethasone and Ondansetron  Airway Management Planned: Oral ETT and Video Laryngoscope Planned  Additional Equipment:   Intra-op Plan:   Post-operative Plan: Extubation in OR  Informed Consent: I have reviewed the patients History and Physical, chart, labs and discussed the procedure including the risks, benefits and alternatives for the proposed anesthesia with the patient or authorized representative who has indicated his/her understanding and acceptance.     Dental advisory given  Plan Discussed with: CRNA  Anesthesia Plan Comments: (PAT note written 01/18/2021 by Shonna Chock, PA-C. )      Anesthesia Quick Evaluation

## 2021-01-18 NOTE — Progress Notes (Addendum)
PCP: Nathaniel Po, FNP at [facility].    I requested : last office notes requested, EKG, Echo. I also requested ECHO result from [facility] where he had it done. Cardiologist: none.  Echo: [facility]- I requested report.  [name] denies chest pain or shortness of breath at rest. Patient denies having any s/s of Covid in his household.  Patient denies any known exposure to Covid.   Patient will be tested on arrival.  [name] reported that in November 2021, he would  wake up with "an elephant on my chest, and I would get anxious, thinking I was having a heart attack and I would become short of breath."  [name] reports that it only happen when he was lying in bed. Patient would get out of bed and walk around and it began to lessen, pain subsided in about 10 mins.  [name]  reported that the pain happened several times, and he then slept in a recliner and has not had pain since. [name] reports that he has been told for years that he has a heart murmer, but it was not anything to be concerned with. PCP ordered an ECHO after patient informed her of chest pain. The person that orders Echo's left the position, around the same time, [name] reports that he had the ECHO  recently. Patient states that he had not received a call a call with results.  [name] denies GERD.  [name] reports PCP put him on Lorstan recently.  "My pressure was very high". Patient states that blood pressure has been good at rechecks. ED visit 01/02/21, blood pressure was 130/90 heart rate 68.  [name] was seen in [facility] ED  after he fell and hit his head, 01/02/21 after numerous falls and stating that he has to drag his left.A CT of the head was done. [name] was referred to [facility].

## 2021-01-19 ENCOUNTER — Encounter (HOSPITAL_COMMUNITY): Payer: Self-pay | Admitting: Neurosurgery

## 2021-01-19 ENCOUNTER — Other Ambulatory Visit: Payer: Self-pay

## 2021-01-19 ENCOUNTER — Ambulatory Visit (HOSPITAL_COMMUNITY): Payer: Self-pay | Admitting: Vascular Surgery

## 2021-01-19 ENCOUNTER — Inpatient Hospital Stay (HOSPITAL_COMMUNITY)
Admission: RE | Admit: 2021-01-19 | Discharge: 2021-01-20 | DRG: 472 | Disposition: A | Discharge status: Home Health | Payer: Self-pay | Attending: Neurosurgery | Admitting: Neurosurgery

## 2021-01-19 ENCOUNTER — Ambulatory Visit (HOSPITAL_COMMUNITY): Payer: Self-pay

## 2021-01-19 ENCOUNTER — Inpatient Hospital Stay (HOSPITAL_COMMUNITY)
Admission: RE | Disposition: A | Discharge status: Home Health | Payer: Self-pay | Source: Home / Self Care | Attending: Neurosurgery

## 2021-01-19 DIAGNOSIS — Z87891 Personal history of nicotine dependence: Secondary | ICD-10-CM

## 2021-01-19 DIAGNOSIS — R296 Repeated falls: Secondary | ICD-10-CM | POA: Diagnosis present

## 2021-01-19 DIAGNOSIS — M2578 Osteophyte, vertebrae: Secondary | ICD-10-CM | POA: Diagnosis present

## 2021-01-19 DIAGNOSIS — Z419 Encounter for procedure for purposes other than remedying health state, unspecified: Secondary | ICD-10-CM

## 2021-01-19 DIAGNOSIS — M21372 Foot drop, left foot: Secondary | ICD-10-CM | POA: Diagnosis present

## 2021-01-19 DIAGNOSIS — M50023 Cervical disc disorder at C6-C7 level with myelopathy: Secondary | ICD-10-CM | POA: Diagnosis present

## 2021-01-19 DIAGNOSIS — Z79899 Other long term (current) drug therapy: Secondary | ICD-10-CM

## 2021-01-19 DIAGNOSIS — I10 Essential (primary) hypertension: Secondary | ICD-10-CM | POA: Diagnosis present

## 2021-01-19 DIAGNOSIS — Z20822 Contact with and (suspected) exposure to covid-19: Secondary | ICD-10-CM | POA: Diagnosis present

## 2021-01-19 DIAGNOSIS — G4730 Sleep apnea, unspecified: Secondary | ICD-10-CM | POA: Diagnosis present

## 2021-01-19 DIAGNOSIS — M50223 Other cervical disc displacement at C6-C7 level: Secondary | ICD-10-CM | POA: Diagnosis present

## 2021-01-19 DIAGNOSIS — G992 Myelopathy in diseases classified elsewhere: Secondary | ICD-10-CM | POA: Diagnosis present

## 2021-01-19 DIAGNOSIS — M4802 Spinal stenosis, cervical region: Principal | ICD-10-CM | POA: Diagnosis present

## 2021-01-19 HISTORY — PX: ANTERIOR CERVICAL DECOMP/DISCECTOMY FUSION: SHX1161

## 2021-01-19 HISTORY — DX: Sleep apnea, unspecified: G47.30

## 2021-01-19 HISTORY — DX: Cardiac murmur, unspecified: R01.1

## 2021-01-19 HISTORY — DX: Unspecified osteoarthritis, unspecified site: M19.90

## 2021-01-19 HISTORY — DX: Depression, unspecified: F32.A

## 2021-01-19 LAB — TYPE AND SCREEN
ABO/RH(D): A POS
Antibody Screen: NEGATIVE

## 2021-01-19 LAB — SARS CORONAVIRUS 2 BY RT PCR (HOSPITAL ORDER, PERFORMED IN ~~LOC~~ HOSPITAL LAB): SARS Coronavirus 2: NEGATIVE

## 2021-01-19 LAB — CBC
HCT: 44.4 % (ref 39.0–52.0)
Hemoglobin: 14.6 g/dL (ref 13.0–17.0)
MCH: 31.5 pg (ref 26.0–34.0)
MCHC: 32.9 g/dL (ref 30.0–36.0)
MCV: 95.9 fL (ref 80.0–100.0)
Platelets: 164 10*3/uL (ref 150–400)
RBC: 4.63 MIL/uL (ref 4.22–5.81)
RDW: 13.5 % (ref 11.5–15.5)
WBC: 6.9 10*3/uL (ref 4.0–10.5)
nRBC: 0 % (ref 0.0–0.2)

## 2021-01-19 LAB — COMPREHENSIVE METABOLIC PANEL
ALT: 22 U/L (ref 0–44)
AST: 13 U/L — ABNORMAL LOW (ref 15–41)
Albumin: 3.6 g/dL (ref 3.5–5.0)
Alkaline Phosphatase: 52 U/L (ref 38–126)
Anion gap: 8 (ref 5–15)
BUN: 17 mg/dL (ref 6–20)
CO2: 29 mmol/L (ref 22–32)
Calcium: 9.5 mg/dL (ref 8.9–10.3)
Chloride: 101 mmol/L (ref 98–111)
Creatinine, Ser: 1.08 mg/dL (ref 0.61–1.24)
GFR, Estimated: >60 mL/min (ref >60)
Glucose, Bld: 110 mg/dL — ABNORMAL HIGH (ref 70–99)
Potassium: 3.8 mmol/L (ref 3.5–5.1)
Sodium: 138 mmol/L (ref 135–145)
Total Bilirubin: 1.3 mg/dL — ABNORMAL HIGH (ref 0.3–1.2)
Total Protein: 6.5 g/dL (ref 6.5–8.1)

## 2021-01-19 LAB — ABO/RH: ABO/RH(D): A POS

## 2021-01-19 LAB — SURGICAL PCR SCREEN
MRSA, PCR: NEGATIVE
Staphylococcus aureus: NEGATIVE

## 2021-01-19 SURGERY — ANTERIOR CERVICAL DECOMPRESSION/DISCECTOMY FUSION 2 LEVELS
Anesthesia: General

## 2021-01-19 MED ORDER — ACETAMINOPHEN 500 MG PO TABS
1000.0000 mg | ORAL_TABLET | Freq: Once | ORAL | Status: AC
  Administered 2021-01-19: 1000 mg via ORAL
  Filled 2021-01-19: qty 2

## 2021-01-19 MED ORDER — CHLORHEXIDINE GLUCONATE 0.12 % MT SOLN
15.0000 mL | Freq: Once | OROMUCOSAL | Status: AC
  Administered 2021-01-19: 15 mL via OROMUCOSAL
  Filled 2021-01-19: qty 15

## 2021-01-19 MED ORDER — SODIUM CHLORIDE 0.9 % IV SOLN: INTRAVENOUS | Status: DC

## 2021-01-19 MED ORDER — FLEET ENEMA 7-19 GM/118ML RE ENEM: 1.0000 | ENEMA | Freq: Once | RECTAL | Status: DC | PRN

## 2021-01-19 MED ORDER — CEFAZOLIN IN SODIUM CHLORIDE 3-0.9 GM/100ML-% IV SOLN
3.0000 g | INTRAVENOUS | Status: AC
  Administered 2021-01-19: 25 g via INTRAVENOUS
  Administered 2021-01-19: 3 g via INTRAVENOUS
  Filled 2021-01-19: qty 100

## 2021-01-19 MED ORDER — LIDOCAINE 2% (20 MG/ML) 5 ML SYRINGE
INTRAMUSCULAR | Status: DC | PRN
  Administered 2021-01-19: 100 mg via INTRAVENOUS

## 2021-01-19 MED ORDER — PHENYLEPHRINE HCL-NACL 20-0.9 MG/250ML-% IV SOLN
INTRAVENOUS | Status: DC | PRN
Start: 2021-01-19 — End: 2021-01-19
  Administered 2021-01-19: 25 ug/min via INTRAVENOUS

## 2021-01-19 MED ORDER — OXYCODONE HCL 5 MG PO TABS: 5.0000 mg | ORAL_TABLET | ORAL | Status: DC | PRN

## 2021-01-19 MED ORDER — MIDAZOLAM HCL 2 MG/2ML IJ SOLN
INTRAMUSCULAR | Status: DC | PRN
  Administered 2021-01-19: 2 mg via INTRAVENOUS

## 2021-01-19 MED ORDER — BISACODYL 10 MG RE SUPP: 10.0000 mg | Freq: Every day | RECTAL | Status: DC | PRN

## 2021-01-19 MED ORDER — DEXAMETHASONE SODIUM PHOSPHATE 10 MG/ML IJ SOLN
INTRAMUSCULAR | Status: AC
  Filled 2021-01-19: qty 1

## 2021-01-19 MED ORDER — PANTOPRAZOLE SODIUM 40 MG IV SOLR: 40.0000 mg | Freq: Every day | INTRAVENOUS | Status: DC

## 2021-01-19 MED ORDER — POLYETHYLENE GLYCOL 3350 17 G PO PACK: 17.0000 g | PACK | Freq: Every day | ORAL | Status: DC | PRN

## 2021-01-19 MED ORDER — LACTATED RINGERS IV SOLN: INTRAVENOUS | Status: DC

## 2021-01-19 MED ORDER — PHENOL 1.4 % MT LIQD: 1.0000 | OROMUCOSAL | Status: DC | PRN

## 2021-01-19 MED ORDER — LACTATED RINGERS IV SOLN: INTRAVENOUS | Status: DC | PRN

## 2021-01-19 MED ORDER — BUPIVACAINE HCL (PF) 0.5 % IJ SOLN
INTRAMUSCULAR | Status: AC
  Filled 2021-01-19: qty 30

## 2021-01-19 MED ORDER — PHENYLEPHRINE 40 MCG/ML (10ML) SYRINGE FOR IV PUSH (FOR BLOOD PRESSURE SUPPORT)
PREFILLED_SYRINGE | INTRAVENOUS | Status: AC
  Filled 2021-01-19: qty 10

## 2021-01-19 MED ORDER — ORAL CARE MOUTH RINSE: 15.0000 mL | Freq: Once | OROMUCOSAL | Status: AC

## 2021-01-19 MED ORDER — PROMETHAZINE HCL 25 MG/ML IJ SOLN: 6.2500 mg | INTRAMUSCULAR | Status: DC | PRN

## 2021-01-19 MED ORDER — SODIUM CHLORIDE 0.9 % IV SOLN: 250.0000 mL | INTRAVENOUS | Status: DC

## 2021-01-19 MED ORDER — ROCURONIUM BROMIDE 10 MG/ML (PF) SYRINGE
PREFILLED_SYRINGE | INTRAVENOUS | Status: AC
  Filled 2021-01-19: qty 10

## 2021-01-19 MED ORDER — SUGAMMADEX SODIUM 200 MG/2ML IV SOLN
INTRAVENOUS | Status: DC | PRN
  Administered 2021-01-19: 50 mg via INTRAVENOUS

## 2021-01-19 MED ORDER — BUPIVACAINE HCL 0.5 % IJ SOLN
INTRAMUSCULAR | Status: DC | PRN
  Administered 2021-01-19: 5 mL

## 2021-01-19 MED ORDER — LIDOCAINE 2% (20 MG/ML) 5 ML SYRINGE
INTRAMUSCULAR | Status: AC
  Filled 2021-01-19: qty 5

## 2021-01-19 MED ORDER — LIDOCAINE-EPINEPHRINE 1 %-1:100000 IJ SOLN
INTRAMUSCULAR | Status: DC | PRN
  Administered 2021-01-19: 5 mL

## 2021-01-19 MED ORDER — FENTANYL CITRATE (PF) 250 MCG/5ML IJ SOLN
INTRAMUSCULAR | Status: AC
  Filled 2021-01-19: qty 5

## 2021-01-19 MED ORDER — THROMBIN 5000 UNITS EX SOLR
CUTANEOUS | Status: AC
  Filled 2021-01-19: qty 5000

## 2021-01-19 MED ORDER — FENTANYL CITRATE (PF) 100 MCG/2ML IJ SOLN
INTRAMUSCULAR | Status: AC
  Filled 2021-01-19: qty 2

## 2021-01-19 MED ORDER — ONDANSETRON HCL 4 MG/2ML IJ SOLN
INTRAMUSCULAR | Status: DC | PRN
Start: 2021-01-19 — End: 2021-01-19
  Administered 2021-01-19: 4 mg via INTRAVENOUS

## 2021-01-19 MED ORDER — SENNA 8.6 MG PO TABS
1.0000 | ORAL_TABLET | Freq: Two times a day (BID) | ORAL | Status: DC
  Administered 2021-01-19 – 2021-01-20 (×2): 8.6 mg via ORAL
  Filled 2021-01-19 (×2): qty 1

## 2021-01-19 MED ORDER — THROMBIN 20000 UNITS EX SOLR
CUTANEOUS | Status: AC
  Filled 2021-01-19: qty 20000

## 2021-01-19 MED ORDER — ONDANSETRON HCL 4 MG/2ML IJ SOLN: 4.0000 mg | Freq: Four times a day (QID) | INTRAMUSCULAR | Status: DC | PRN

## 2021-01-19 MED ORDER — ROCURONIUM BROMIDE 10 MG/ML (PF) SYRINGE
PREFILLED_SYRINGE | INTRAVENOUS | Status: DC | PRN
Start: 2021-01-19 — End: 2021-01-19
  Administered 2021-01-19: 60 mg via INTRAVENOUS
  Administered 2021-01-19: 40 mg via INTRAVENOUS
  Administered 2021-01-19 (×2): 30 mg via INTRAVENOUS

## 2021-01-19 MED ORDER — MENTHOL 3 MG MT LOZG
1.0000 | LOZENGE | OROMUCOSAL | Status: DC | PRN
  Filled 2021-01-19: qty 9

## 2021-01-19 MED ORDER — DOCUSATE SODIUM 100 MG PO CAPS
100.0000 mg | ORAL_CAPSULE | Freq: Two times a day (BID) | ORAL | Status: DC
  Administered 2021-01-19 – 2021-01-20 (×2): 100 mg via ORAL
  Filled 2021-01-19 (×2): qty 1

## 2021-01-19 MED ORDER — THROMBIN 5000 UNITS EX SOLR
OROMUCOSAL | Status: DC | PRN
  Administered 2021-01-19 (×2): 5 mL via TOPICAL

## 2021-01-19 MED ORDER — MIDAZOLAM HCL 2 MG/2ML IJ SOLN
INTRAMUSCULAR | Status: AC
  Filled 2021-01-19: qty 2

## 2021-01-19 MED ORDER — PANTOPRAZOLE SODIUM 40 MG PO TBEC
40.0000 mg | DELAYED_RELEASE_TABLET | Freq: Every day | ORAL | Status: DC
  Administered 2021-01-19: 40 mg via ORAL
  Filled 2021-01-19: qty 1

## 2021-01-19 MED ORDER — PROPOFOL 10 MG/ML IV BOLUS
INTRAVENOUS | Status: AC
  Filled 2021-01-19: qty 20

## 2021-01-19 MED ORDER — CHLORHEXIDINE GLUCONATE CLOTH 2 % EX PADS: 6.0000 | MEDICATED_PAD | Freq: Once | CUTANEOUS | Status: DC

## 2021-01-19 MED ORDER — ONDANSETRON HCL 4 MG/2ML IJ SOLN
INTRAMUSCULAR | Status: AC
  Filled 2021-01-19: qty 2

## 2021-01-19 MED ORDER — SODIUM CHLORIDE 0.9% FLUSH: 3.0000 mL | INTRAVENOUS | Status: DC | PRN

## 2021-01-19 MED ORDER — METHOCARBAMOL 1000 MG/10ML IJ SOLN
500.0000 mg | Freq: Four times a day (QID) | INTRAVENOUS | Status: DC | PRN
  Filled 2021-01-19: qty 5

## 2021-01-19 MED ORDER — ACETAMINOPHEN 325 MG PO TABS
650.0000 mg | ORAL_TABLET | ORAL | Status: DC | PRN
  Administered 2021-01-19 – 2021-01-20 (×3): 650 mg via ORAL
  Filled 2021-01-19 (×3): qty 2

## 2021-01-19 MED ORDER — LOSARTAN POTASSIUM 50 MG PO TABS
25.0000 mg | ORAL_TABLET | Freq: Every day | ORAL | Status: DC
  Administered 2021-01-19 – 2021-01-20 (×2): 25 mg via ORAL
  Filled 2021-01-19 (×2): qty 1

## 2021-01-19 MED ORDER — DEXAMETHASONE SODIUM PHOSPHATE 10 MG/ML IJ SOLN
INTRAMUSCULAR | Status: DC | PRN
  Administered 2021-01-19: 2 mg via INTRAVENOUS
  Administered 2021-01-19: 8 mg via INTRAVENOUS

## 2021-01-19 MED ORDER — 0.9 % SODIUM CHLORIDE (POUR BTL) OPTIME
TOPICAL | Status: DC | PRN
  Administered 2021-01-19: 1000 mL

## 2021-01-19 MED ORDER — PROPOFOL 10 MG/ML IV BOLUS
INTRAVENOUS | Status: DC | PRN
Start: 2021-01-19 — End: 2021-01-19
  Administered 2021-01-19: 150 mg via INTRAVENOUS

## 2021-01-19 MED ORDER — SODIUM CHLORIDE 0.9% FLUSH
3.0000 mL | Freq: Two times a day (BID) | INTRAVENOUS | Status: DC
  Administered 2021-01-19: 3 mL via INTRAVENOUS

## 2021-01-19 MED ORDER — OXYCODONE HCL 5 MG PO TABS
10.0000 mg | ORAL_TABLET | ORAL | Status: DC | PRN
Start: 2021-01-19 — End: 2021-01-21
  Administered 2021-01-19 – 2021-01-20 (×8): 10 mg via ORAL
  Filled 2021-01-19 (×8): qty 2

## 2021-01-19 MED ORDER — METHOCARBAMOL 500 MG PO TABS
500.0000 mg | ORAL_TABLET | Freq: Four times a day (QID) | ORAL | Status: DC | PRN
  Administered 2021-01-19 – 2021-01-20 (×4): 500 mg via ORAL
  Filled 2021-01-19 (×4): qty 1

## 2021-01-19 MED ORDER — FENTANYL CITRATE (PF) 250 MCG/5ML IJ SOLN
INTRAMUSCULAR | Status: DC | PRN
  Administered 2021-01-19 (×2): 50 ug via INTRAVENOUS
  Administered 2021-01-19: 150 ug via INTRAVENOUS

## 2021-01-19 MED ORDER — ONDANSETRON HCL 4 MG PO TABS: 4.0000 mg | ORAL_TABLET | Freq: Four times a day (QID) | ORAL | Status: DC | PRN

## 2021-01-19 MED ORDER — LIDOCAINE-EPINEPHRINE 1 %-1:100000 IJ SOLN
INTRAMUSCULAR | Status: AC
  Filled 2021-01-19: qty 1

## 2021-01-19 MED ORDER — ACETAMINOPHEN 650 MG RE SUPP: 650.0000 mg | RECTAL | Status: DC | PRN

## 2021-01-19 MED ORDER — MORPHINE SULFATE (PF) 4 MG/ML IV SOLN
4.0000 mg | INTRAVENOUS | Status: DC | PRN
Start: 2021-01-19 — End: 2021-01-21
  Administered 2021-01-19: 4 mg via INTRAVENOUS
  Filled 2021-01-19: qty 1

## 2021-01-19 MED ORDER — FENTANYL CITRATE (PF) 100 MCG/2ML IJ SOLN
25.0000 ug | INTRAMUSCULAR | Status: DC | PRN
  Administered 2021-01-19 (×3): 50 ug via INTRAVENOUS

## 2021-01-19 MED ORDER — CEFAZOLIN SODIUM-DEXTROSE 2-4 GM/100ML-% IV SOLN
2.0000 g | Freq: Three times a day (TID) | INTRAVENOUS | Status: AC
  Administered 2021-01-19 – 2021-01-20 (×2): 2 g via INTRAVENOUS
  Filled 2021-01-19 (×2): qty 100

## 2021-01-19 SURGICAL SUPPLY — 83 items
BAG COUNTER SPONGE SURGICOUNT (BAG) ×4 IMPLANT
BAG SURGICOUNT SPONGE COUNTING (BAG) ×2
BAND RUBBER #18 3X1/16 STRL (MISCELLANEOUS) ×6 IMPLANT
BENZOIN TINCTURE PRP APPL 2/3 (GAUZE/BANDAGES/DRESSINGS) IMPLANT
BIT DRILL 13 (BIT) ×2 IMPLANT
BIT DRILL 13MM (BIT) ×1
BLADE CLIPPER SURG (BLADE) IMPLANT
BLADE SURG 11 STRL SS (BLADE) ×3 IMPLANT
BLADE ULTRA TIP 2M (BLADE) IMPLANT
BNDG GAUZE ELAST 4 BULKY (GAUZE/BANDAGES/DRESSINGS) IMPLANT
BUR MATCHSTICK NEURO 3.0 LAGG (BURR) ×3 IMPLANT
BUR ROUND FLUTED 4 SOFT TCH (BURR) ×2 IMPLANT
BUR ROUND FLUTED 4MM SOFT TCH (BURR) ×1
CAGE PEEK 10X14X11 (Cage) ×2 IMPLANT
CAGE PEEK 10X14X11MM (Cage) ×1 IMPLANT
CANISTER SUCT 3000ML PPV (MISCELLANEOUS) ×3 IMPLANT
CARTRIDGE OIL MAESTRO DRILL (MISCELLANEOUS) ×1 IMPLANT
CLOSURE WOUND 1/2 X4 (GAUZE/BANDAGES/DRESSINGS)
DECANTER SPIKE VIAL GLASS SM (MISCELLANEOUS) ×3 IMPLANT
DERMABOND ADVANCED (GAUZE/BANDAGES/DRESSINGS) ×2
DERMABOND ADVANCED .7 DNX12 (GAUZE/BANDAGES/DRESSINGS) ×1 IMPLANT
DEVICE EDNSKLTN TC NNLCK MED 8 (Cage) ×1 IMPLANT
DIFFUSER DRILL AIR PNEUMATIC (MISCELLANEOUS) ×3 IMPLANT
DRAIN CHANNEL 10M FLAT 3/4 FLT (DRAIN) IMPLANT
DRAPE C-ARM 42X72 X-RAY (DRAPES) ×6 IMPLANT
DRAPE HALF SHEET 40X57 (DRAPES) ×3 IMPLANT
DRAPE LAPAROTOMY 100X72 PEDS (DRAPES) ×3 IMPLANT
DRAPE MICROSCOPE LEICA (MISCELLANEOUS) ×3 IMPLANT
DRSG OPSITE 4X5.5 SM (GAUZE/BANDAGES/DRESSINGS) ×6 IMPLANT
DRSG OPSITE POSTOP 3X4 (GAUZE/BANDAGES/DRESSINGS) ×6 IMPLANT
DRSG OPSITE POSTOP 4X6 (GAUZE/BANDAGES/DRESSINGS) ×3 IMPLANT
DURAPREP 6ML APPLICATOR 50/CS (WOUND CARE) ×3 IMPLANT
ELECT COATED BLADE 2.86 ST (ELECTRODE) ×3 IMPLANT
ELECT REM PT RETURN 9FT ADLT (ELECTROSURGICAL) ×3
ELECTRODE REM PT RTRN 9FT ADLT (ELECTROSURGICAL) ×1 IMPLANT
ENDOSKELTON IMPLANT TC MED 8MM (Cage) ×3 IMPLANT
EVACUATOR SILICONE 100CC (DRAIN) IMPLANT
FORCEPS BIPOLAR SPETZLER 8 1.0 (NEUROSURGERY SUPPLIES) ×3 IMPLANT
GAUZE 4X4 16PLY ~~LOC~~+RFID DBL (SPONGE) ×3 IMPLANT
GLOVE EXAM NITRILE XL STR (GLOVE) IMPLANT
GLOVE SURG ENC MOIS LTX SZ7.5 (GLOVE) IMPLANT
GLOVE SURG LTX SZ7 (GLOVE) ×3 IMPLANT
GLOVE SURG UNDER POLY LF SZ7.5 (GLOVE) ×6 IMPLANT
GOWN STRL REUS W/ TWL LRG LVL3 (GOWN DISPOSABLE) ×2 IMPLANT
GOWN STRL REUS W/ TWL XL LVL3 (GOWN DISPOSABLE) IMPLANT
GOWN STRL REUS W/TWL 2XL LVL3 (GOWN DISPOSABLE) IMPLANT
GOWN STRL REUS W/TWL LRG LVL3 (GOWN DISPOSABLE) ×6
GOWN STRL REUS W/TWL XL LVL3 (GOWN DISPOSABLE)
HEMOSTAT POWDER KIT SURGIFOAM (HEMOSTASIS) ×6 IMPLANT
KIT BASIN OR (CUSTOM PROCEDURE TRAY) ×3 IMPLANT
KIT TURNOVER KIT B (KITS) ×3 IMPLANT
NEEDLE HYPO 22GX1.5 SAFETY (NEEDLE) ×3 IMPLANT
NEEDLE SPNL 22GX3.5 QUINCKE BK (NEEDLE) ×3 IMPLANT
NS IRRIG 1000ML POUR BTL (IV SOLUTION) ×3 IMPLANT
OIL CARTRIDGE MAESTRO DRILL (MISCELLANEOUS) ×3
PACK LAMINECTOMY NEURO (CUSTOM PROCEDURE TRAY) ×3 IMPLANT
PAD ARMBOARD 7.5X6 YLW CONV (MISCELLANEOUS) ×9 IMPLANT
PEEK ANATOMIC STRUT 5X14X11MM (Peek) ×3 IMPLANT
PLATE ZEVO 1LVL 19MM (Plate) ×3 IMPLANT
PLATE ZEVO 2LVL 41MM (Plate) ×3 IMPLANT
PUTTY DBF 1CC CORTICAL FIBERS (Putty) ×6 IMPLANT
SCREW 3.5 SELFDRILL 15MM VARI (Screw) ×12 IMPLANT
SCREW 4.0 SELFDRILL 15MM VARI (Screw) ×6 IMPLANT
SPACER PEEK CERV 14X11X13 (Spacer) ×3 IMPLANT
SPACER SPNL 11X14X10XPEEK (Cage) ×1 IMPLANT
SPCR SPNL 11X14X10XPEEK (Cage) ×1 IMPLANT
SPONGE INTESTINAL PEANUT (DISPOSABLE) ×3 IMPLANT
SPONGE SURGIFOAM ABS GEL 100 (HEMOSTASIS) IMPLANT
SPONGE T-LAP 4X18 ~~LOC~~+RFID (SPONGE) ×3 IMPLANT
STRIP CLOSURE SKIN 1/2X4 (GAUZE/BANDAGES/DRESSINGS) IMPLANT
SUT ETHILON 3 0 FSL (SUTURE) IMPLANT
SUT SILK 3 0 (SUTURE) ×3
SUT SILK 3-0 18XBRD TIE 12 (SUTURE) ×1 IMPLANT
SUT VIC AB 0 CT1 18XCR BRD8 (SUTURE) ×1 IMPLANT
SUT VIC AB 0 CT1 8-18 (SUTURE) ×3
SUT VIC AB 3-0 SH 8-18 (SUTURE) ×3 IMPLANT
SUT VICRYL 3-0 RB1 18 ABS (SUTURE) ×3 IMPLANT
TAPE CLOTH 3X10 TAN LF (GAUZE/BANDAGES/DRESSINGS) ×3 IMPLANT
TOWEL GREEN STERILE (TOWEL DISPOSABLE) ×3 IMPLANT
TOWEL GREEN STERILE FF (TOWEL DISPOSABLE) ×3 IMPLANT
TUBE CONNECTING 20'X1/4 (TUBING) ×1
TUBE CONNECTING 20X1/4 (TUBING) ×2 IMPLANT
WATER STERILE IRR 1000ML POUR (IV SOLUTION) ×3 IMPLANT

## 2021-01-19 NOTE — OR Nursing (Signed)
Per call from Dr Renette Butters, radiologist, "no evidence of metallic foreign body" on xray.

## 2021-01-19 NOTE — Progress Notes (Signed)
Orthopedic Tech Progress Note Patient Details:  [name] 05-May-1971 [id number]  Dropped off ASPEN CERVICAL COLLAR to patient in PACU   Patient ID: [name], male   DOB: 1971/02/18, 50 y.o.   MRN: [id number]  Donald Pore 01/19/2021, 2:49 PM

## 2021-01-19 NOTE — H&P (Signed)
Chief Complaint  Weakness  History of Present Illness  [name] is a 50 y.o. male initially seen in the outpatient neurosurgery clinic after visit to the emergency department where he was seen for progressively worsening weakness after falling down a ramp about 5 to 6 weeks ago.  Since that time, he has noted worsening weakness in his left leg.  He also does complain of some low back and buttock pain.  Because of his weakness he has been using crutches to help walk as well as occasional use of a wheelchair.  He has also noted significant numbness in the third through fifth digits of his left hand and all the fingers in his right hand.  He has also noted subjective weakness and paresthesias in both his hands.  His work-up has included MRI of the cervical and thoracic spine which demonstrated severe stenosis at C5-6 and C6-7 due to large herniated disks with spinal cord compression and T2 signal change.  He therefore presents today for surgical decompression.  Past Medical History   Past Medical History:  Diagnosis Date   Arthritis    Depression    Heart murmur    Hypertension    Sleep apnea     Past Surgical History   Past Surgical History:  Procedure Laterality Date   EXTERNAL EAR SURGERY     VARICOSE VEIN SURGERY Left     Social History   Social History   Tobacco Use   Smoking status: Former    Years: 5.00    Types: Cigarettes    Quit date: 1997    Years since quitting: 25.6   Smokeless tobacco: Current    Types: Snuff  Vaping Use   Vaping Use: Never used  Substance Use Topics   Alcohol use: Yes    Alcohol/week: 40.0 standard drinks    Types: 40 Cans of beer per week   Drug use: Yes    Frequency: 7.0 times per week    Types: Marijuana    Comment: more than 3 times ago    Medications   Prior to Admission medications   Medication Sig Start Date End Date Taking? Authorizing Provider  losartan (COZAAR) 25 MG tablet Take 25 mg by mouth daily. 12/21/20  Yes  [provider]  diclofenac (CATAFLAM) 50 MG tablet Take 50 mg by mouth daily. 01/05/21   [provider]  gabapentin (NEURONTIN) 600 MG tablet Take 600 mg by mouth 2 (two) times daily.    [provider]    Allergies  No Known Allergies  Review of Systems  ROS  Neurologic Exam  Awake, alert, oriented Memory and concentration grossly intact Speech fluent, appropriate CN grossly intact Motor exam: Upper Extremities Deltoid Bicep Tricep Grip  Right 5/5 5/5 5/5 4+/5  Left 5/5 5/5 5/5 4-/5   Lower Extremities IP Quad PF DF EHL  Right 5/5 5/5 5/5 5/5 5/5  Left 2/5 5/5 4/5 4/5 5/5   Sensation grossly intact to LT  Imaging  MRI of the cervical spine demonstrates large disc herniation at C5-6 with associated severe stenosis and spinal cord compression.  There is an extremely large disc herniation at C6-7 with both cranial and caudal migration and associated severe stenosis and severe spinal cord compression with T2 signal change at this level.  Impression  - 50 y.o. male status post fall with rapidly progressive myelopathy due to very large cervical disc herniation at C5-6 and C6-7 with associated severe stenosis and spinal cord compression  Plan  -We will plan on proceeding with ACDF at C5-6 and C6-7.  I have reviewed the imaging findings with the patient.  We have discussed the need for surgical decompression.  I have reviewed with him the associated risks, benefits, and alternatives to surgery as well as the expected postoperative course and recovery.  All the patient's questions were answered.  He provided informed consent to proceed.  Lisbeth Renshaw, MD [facility]

## 2021-01-19 NOTE — Anesthesia Procedure Notes (Signed)
Procedure Name: Intubation Date/Time: 01/19/2021 9:09 AM Performed by: Modena Morrow, CRNA Pre-anesthesia Checklist: Patient identified, Emergency Drugs available, Suction available and Patient being monitored Patient Re-evaluated:Patient Re-evaluated prior to induction Oxygen Delivery Method: Circle system utilized Preoxygenation: Pre-oxygenation with 100% oxygen Induction Type: IV induction Ventilation: Mask ventilation without difficulty Laryngoscope Size: Glidescope and 4 Grade View: Grade I Tube type: Oral Tube size: 7.5 mm Number of attempts: 1 Airway Equipment and Method: Stylet and Oral airway Placement Confirmation: ETT inserted through vocal cords under direct vision, positive ETCO2 and breath sounds checked- equal and bilateral Secured at: 25 cm Tube secured with: Tape Dental Injury: Teeth and Oropharynx as per pre-operative assessment

## 2021-01-19 NOTE — Anesthesia Postprocedure Evaluation (Signed)
Anesthesia Post Note  Patient: [name]  Procedure(s) Performed: Anterior Cervical Decompression Fusion Cervical Five-Six, Cervical Six-Seven     Patient location during evaluation: PACU Anesthesia Type: General Level of consciousness: awake and alert Pain management: pain level controlled Vital Signs Assessment: post-procedure vital signs reviewed and stable Respiratory status: spontaneous breathing, nonlabored ventilation, respiratory function stable and patient connected to nasal cannula oxygen Cardiovascular status: blood pressure returned to baseline and stable Postop Assessment: no apparent nausea or vomiting Anesthetic complications: no   No notable events documented.  Last Vitals:  Vitals:   01/19/21 1448 01/19/21 1509  BP: 122/71 132/75  Pulse: 61 60  Resp: 17 18  Temp: 36.6 C 36.6 C  SpO2: 96% 99%    Last Pain:  Vitals:   01/19/21 1700  TempSrc:   PainSc: 3                  Cecile Hearing

## 2021-01-19 NOTE — Progress Notes (Signed)
Pt. States he can place on cpap when ready. RT informed pt. To notify if he needs any assistance.

## 2021-01-19 NOTE — Transfer of Care (Signed)
Immediate Anesthesia Transfer of Care Note  Patient: [name]  Procedure(s) Performed: Anterior Cervical Decompression Fusion Cervical Five-Six, Cervical Six-Seven  Patient Location: PACU  Anesthesia Type:General  Level of Consciousness: drowsy and patient cooperative  Airway & Oxygen Therapy: Patient Spontanous Breathing and Patient connected to face mask oxygen  Post-op Assessment: Report given to RN and Post -op Vital signs reviewed and stable  Post vital signs: Reviewed and stable  Last Vitals:  Vitals Value Taken Time  BP 120/69 01/19/21 1303  Temp    Pulse 74 01/19/21 1309  Resp 22 01/19/21 1309  SpO2 97 % 01/19/21 1309  Vitals shown include unvalidated device data.  Last Pain:  Vitals:   01/19/21 0738  PainSc: 5       Patients Stated Pain Goal: 3 (01/19/21 7371)  Complications: No notable events documented.

## 2021-01-20 ENCOUNTER — Encounter (HOSPITAL_COMMUNITY): Payer: Self-pay | Admitting: Neurosurgery

## 2021-01-20 MED ORDER — METHOCARBAMOL 500 MG PO TABS: 500.0000 mg | ORAL_TABLET | Freq: Four times a day (QID) | ORAL | 0 refills | Status: AC | PRN

## 2021-01-20 MED ORDER — OXYCODONE HCL 10 MG PO TABS: 10.0000 mg | ORAL_TABLET | Freq: Four times a day (QID) | ORAL | 0 refills | Status: AC | PRN

## 2021-01-20 MED FILL — Thrombin For Soln 5000 Unit: CUTANEOUS | Qty: 5000 | Status: AC

## 2021-01-20 NOTE — Evaluation (Signed)
Physical Therapy Evaluation Patient Details Name: [name] MRN: [id number] DOB: 20-Jan-1971 Today's Date: 01/20/2021   History of Present Illness  Pt is a 50 y/o male who presents with progressive LE weakness after sustaining a fall 5-6 weeks prior to admission. MRI showed severe stenosis at C5-6 and C6-7 w/ large herniated discs and spinal cord compression, as well as T2 signal change. Pt elected to undergo ACDF at C5-6 and C6-7. PMH: arthritis, depression, heart murmur, HTN, sleep apnea  Clinical Impression  Pt admitted with above diagnosis. At the time of PT eval, pt was able to demonstrate transfers and ambulation with gross min guard assist to supervision for safety and RW for support. Pt prefers his rollator but has been utilizing the power scooter at home due to frequent falls. Pt was educated on precautions, brace application/wearing schedule, appropriate activity progression, and car transfer. Pt currently with functional limitations due to the deficits listed below (see PT Problem List). Pt will benefit from skilled PT to increase their independence and safety with mobility to allow discharge to the venue listed below.      Follow Up Recommendations Outpatient PT;Supervision for mobility/OOB (MD to order when appropriate per post-op protocol.)    Equipment Recommendations  None recommended by PT    Recommendations for Other Services       Precautions / Restrictions Precautions Precautions: Fall;Cervical Precaution Booklet Issued: Yes (comment) Required Braces or Orthoses: Cervical Brace Cervical Brace: Hard collar Restrictions Weight Bearing Restrictions: No      Mobility  Bed Mobility               General bed mobility comments: sitting EOB on entry. Pt plans to sleep in the recliner upon return home.    Transfers Overall transfer level: Needs assistance Equipment used: Rolling walker (2 wheeled);None Transfers: Sit to/from Stand Sit to Stand: Supervision          General transfer comment: Increased bed height. Pt with proper hand placement on seated surface however supervision provided for safety.  Ambulation/Gait Ambulation/Gait assistance: Min guard;Supervision Gait Distance (Feet): 300 Feet Assistive device: Rolling walker (2 wheeled) Gait Pattern/deviations: Step-through pattern;Decreased stride length;Trunk flexed Gait velocity: Decreased Gait velocity interpretation: <1.31 ft/sec, indicative of household ambulator General Gait Details: VC's for closer walker proximity, improved posture, and forward gaze. Pt with decreased floor clearance on the L (baseline dragging LLE) however improved by end of gait training. No assist required however min guard provided initially progressing to supervision for safety.  Stairs            Wheelchair Mobility    Modified Rankin (Stroke Patients Only)       Balance Overall balance assessment: Needs assistance;History of Falls Sitting-balance support: No upper extremity supported;Feet supported Sitting balance-Leahy Scale: Good     Standing balance support: No upper extremity supported;During functional activity Standing balance-Leahy Scale: Fair Standing balance comment: fair static standing, hx of falls and limitations noted with dynamic tasks                             Pertinent Vitals/Pain Pain Assessment: Faces Faces Pain Scale: Hurts even more Pain Location: neck at incision site Pain Descriptors / Indicators: Grimacing;Guarding;Shooting Pain Intervention(s): Limited activity within patient's tolerance;Monitored during session;Repositioned    Home Living Family/patient expects to be discharged to:: Private residence Living Arrangements: Alone Available Help at Discharge: Family;Friend(s);Available PRN/intermittently Type of Home: House Home Access: Ramped entrance  Home Layout: One level Home Equipment: Walker - 4 wheels;Shower seat - built in;Grab bars  - tub/shower;Hand held shower head;Adaptive equipment;Electric scooter Additional Comments: brother lives next door    Prior Function Level of Independence: Independent with assistive device(s)         Comments: Reports frequent falls prior to admission. Had been using electric scooter in the home due to fall risk. Pt reports difficulty gripping ADL items and it would take 1 hour to get dressed, difficulty with all ADLs/IADLs though pt reports able to manage. Pt reports not leaving the home much, has food delivered though also reports using stand-up mower in recent week.     Hand Dominance   Dominant Hand: Right    Extremity/Trunk Assessment   Upper Extremity Assessment Upper Extremity Assessment: RUE deficits/detail RUE Deficits / Details: unable to fully extend digits, 3+/5 hand grasp, mild limitatations in wrist extension (hx of gout). Reports sensation improving though still some numbness and decreased coordination. RUE Sensation: decreased light touch RUE Coordination: decreased fine motor LUE Deficits / Details: 3+/5 grasp strength. This UE appears stronger than dominant hand. Reports sensation improving though still some numbness and decreased coordination. LUE Sensation: decreased light touch LUE Coordination: decreased fine motor    Lower Extremity Assessment Lower Extremity Assessment: LLE deficits/detail LLE Deficits / Details: Decreased strength consistent with pre-op diagnosis. Improving steadily since surgery    Cervical / Trunk Assessment Cervical / Trunk Assessment: Other exceptions (s/p surgery)  Communication   Communication: No difficulties  Cognition Arousal/Alertness: Awake/alert Behavior During Therapy: WFL for tasks assessed/performed Overall Cognitive Status: Within Functional Limits for tasks assessed                                 General Comments: WFL cognitively, some decreased awareness of deficits and impact of precautions on  daily tasks though receptive of education      General Comments General comments (skin integrity, edema, etc.): Provided gait belt    Exercises     Assessment/Plan    PT Assessment Patient needs continued PT services  PT Problem List Decreased strength;Decreased activity tolerance;Decreased balance;Decreased mobility;Decreased knowledge of use of DME;Decreased safety awareness;Decreased knowledge of precautions;Pain       PT Treatment Interventions DME instruction;Gait training;Functional mobility training;Therapeutic activities;Therapeutic exercise;Neuromuscular re-education;Patient/family education    PT Goals (Current goals can be found in the Care Plan section)  Acute Rehab PT Goals Patient Stated Goal: Stop using the power scooter PT Goal Formulation: With patient/family Time For Goal Achievement: 01/27/21 Potential to Achieve Goals: Good    Frequency Min 5X/week   Barriers to discharge        Co-evaluation               AM-PAC PT "6 Clicks" Mobility  Outcome Measure Help needed turning from your back to your side while in a flat bed without using bedrails?: A Little Help needed moving from lying on your back to sitting on the side of a flat bed without using bedrails?: A Little Help needed moving to and from a bed to a chair (including a wheelchair)?: A Little Help needed standing up from a chair using your arms (e.g., wheelchair or bedside chair)?: A Little Help needed to walk in hospital room?: A Little Help needed climbing 3-5 steps with a railing? : A Little 6 Click Score: 18    End of Session Equipment Utilized During Treatment: Gait belt;Cervical collar  Activity Tolerance: Patient tolerated treatment well Patient left: with call bell/phone within reach;with family/visitor present (Sitting EOB) Nurse Communication: Mobility status PT Visit Diagnosis: Unsteadiness on feet (R26.81);Pain Pain - part of body:  (neck)    Time: 4709-6283 PT Time  Calculation (min) (ACUTE ONLY): 28 min   Charges:   PT Evaluation $PT Eval Low Complexity: 1 Low PT Treatments $Gait Training: 8-22 mins        Conni Slipper, PT, DPT Acute Rehabilitation Services Pager: [phone] Office: [phone]   Marylynn Pearson 01/20/2021, 10:18 AM

## 2021-01-20 NOTE — TOC Progression Note (Signed)
Transition of Care [facility]) - Progression Note    Patient Details  Name: [name] MRN: [id number] Date of Birth: May 31, 1971  [facility] CM/SW Contact  Beckie Busing, RN Phone Number:[phone]  01/20/2021, 11:21 AM  Clinical Narrative:    [facility] consulted for outpatient PT and DME needs. Bariatric BSC has been ordered through [employer] on charity for patient without insurance. [employer] will deliver to the room. CM spoke with patient about outpatient PT. Patient states that he doesn't want to set this up because he does not have insurance and he can not afford to pay anything.  Patient states that he will use the handouts that PT gave him and he will work on his exercises at home.         Expected Discharge Plan and Services                                                 Social Determinants of Health (SDOH) Interventions    Readmission Risk Interventions No flowsheet data found.

## 2021-01-20 NOTE — Progress Notes (Signed)
Patient was transported via wheelchair by volunteer for discharge home; in no acute distress nor complaints of pain nor discomfort; room was checked and accounted for all his belongings; discharge instructions given to patient and his girlfriend and both verbalized understanding on the instructions given.

## 2021-01-20 NOTE — Discharge Summary (Signed)
Physician Discharge Summary  Patient ID: [name] MRN: [id number] DOB/AGE: August 20, 1970 50 y.o.  Admit date: 01/19/2021 Discharge date: 01/20/2021  Admission Diagnoses: Cervical stenosis with myelopathy  Discharge Diagnoses: Same Active Problems:   Stenosis of cervical spine with myelopathy [facility])   Discharged Condition: Stable  Hospital Course:  [name] is a 50 y.o. male electively admitted after C6 corpectomy. Postoperatively, the patient was at reproting significant improvement in leg weakness, ambulation, and hand weakness. Also reported improvement in back pain. He was tolerating diet, ambulating with his walker, voiding normally, with pain controlled with oral medication.  Treatments: Surgery - C6 corpectomy  Discharge Exam: Blood pressure 128/85, pulse (!) 53, temperature (!) 97.5 F (36.4 C), temperature source Oral, resp. rate 18, height 6' (1.829 m), weight (!) 142.9 kg, SpO2 100 %. Awake, alert, oriented Speech fluent, appropriate CN grossly intact 5/5 BUE/BLE Wound c/d/i   Disposition: Discharge disposition: 01-Home or Self Care       Discharge Instructions     Call MD for:  redness, tenderness, or signs of infection (pain, swelling, redness, odor or green/yellow discharge around incision site)   Complete by: As directed    Call MD for:  temperature >100.4   Complete by: As directed    Diet - low sodium heart healthy   Complete by: As directed    Discharge instructions   Complete by: As directed    Walk at home as much as possible, at least 4 times / day   Incentive spirometry RT   Complete by: As directed    Increase activity slowly   Complete by: As directed    Lifting restrictions   Complete by: As directed    No lifting > 10 lbs   May shower / Bathe   Complete by: As directed    48 hours after surgery   May walk up steps   Complete by: As directed    Other Restrictions   Complete by: As directed    No bending/twisting at  waist   Remove dressing in 24 hours   Complete by: As directed       Allergies as of 01/20/2021   No Known Allergies      Medication List     STOP taking these medications    diclofenac 50 MG tablet Commonly known as: CATAFLAM   gabapentin 600 MG tablet Commonly known as: NEURONTIN       TAKE these medications    losartan 25 MG tablet Commonly known as: COZAAR Take 25 mg by mouth daily.   methocarbamol 500 MG tablet Commonly known as: ROBAXIN Take 1 tablet (500 mg total) by mouth every 6 (six) hours as needed for muscle spasms.   Oxycodone HCl 10 MG Tabs Take 1 tablet (10 mg total) by mouth every 6 (six) hours as needed for up to 7 days for severe pain ((score 7 to 10)).               Durable Medical Equipment  (From admission, onward)           Start     Ordered   01/20/21 1117  For home use only DME Bedside commode  Once       Comments: Bariatric  Question:  Patient needs a bedside commode to treat with the following condition  Answer:  Weakness   01/20/21 1120            Follow-up Information     Annjeanette Sarwar,  Marlane Hatcher, MD Follow up.   Specialty: Neurosurgery Contact information: [address] [place] [address] [phone]                 Signed: Jackelyn Hoehn 01/20/2021, 1:27 PM

## 2021-01-20 NOTE — Evaluation (Signed)
Occupational Therapy Evaluation Patient Details Name: [name] MRN: [id number] DOB: [birth year] Today's Date: 01/20/2021    History of Present Illness Pt is a 50 y/o male who presents with progressive LE weakness after sustaining a fall 5-6 weeks prior to admission. MRI showed severe stenosis at C5-6 and C6-7 w/ large herniated discs and spinal cord compression, as well as T2 signal change. Pt elected to undergo ACDF at C5-6 and C6-7. PMH: arthritis, depression, heart murmur, HTN, sleep apnea   Clinical Impression   PTA, pt lives alone and reports frequent falls, as well as difficulty managing ADLs/IADLs in the home prior to surgery. Pt reports typically using electric scooter in the home due to his frequent falls. Pt presents now with pain at incision site and improving UE sensation/strength though limitations still evident. Time spent educating pt on cervical precautions for ADLs in the home with pt able to demo use of AE for completion at Supervision level. Pt requires min guard for dynamic tasks, Min A for brace mgmt. Provided putty, squeeze balls and various HEP handouts to progress strength and coordination of BUEs. Recommend BSC use over toilet to maximize safety with ADLs. Pt would also benefit from OT follow-up s/p DC (setting dependent on pt transportation availability). Will continue to follow acutely.     Follow Up Recommendations  Home health OT;Outpatient OT;Supervision - Intermittent (pending transporation availability)    Equipment Recommendations  3 in 1 bedside commode (oversize for placement over toilet)    Recommendations for Other Services       Precautions / Restrictions Precautions Precautions: Fall;Cervical Precaution Booklet Issued: Yes (comment) Required Braces or Orthoses: Cervical Brace Cervical Brace: Hard collar Restrictions Weight Bearing Restrictions: No      Mobility Bed Mobility               General bed mobility comments: sitting EOB on  entry    Transfers Overall transfer level: Needs assistance Equipment used: Rolling walker (2 wheeled);None Transfers: Sit to/from Stand Sit to Stand: Supervision         General transfer comment: able to stand with increased time with RW and without for LB ADLs. cues for posture    Balance Overall balance assessment: Needs assistance;History of Falls Sitting-balance support: No upper extremity supported;Feet supported Sitting balance-Leahy Scale: Good     Standing balance support: No upper extremity supported;During functional activity Standing balance-Leahy Scale: Fair Standing balance comment: fair static standing, hx of falls and limitations noted with dynamic tasks                           ADL either performed or assessed with clinical judgement   ADL Overall ADL's : Needs assistance/impaired Eating/Feeding: Set up;Sitting   Grooming: Set up;Standing   Upper Body Bathing: Set up;Sitting   Lower Body Bathing: Supervison/ safety;Sit to/from stand   Upper Body Dressing : Sitting;Minimal assistance Upper Body Dressing Details (indicate cue type and reason): Min A for cervical brace mgmt, some difficulty feeling and locating velcro straps for adjustment Lower Body Dressing: Supervision/safety;Sit to/from stand;With adaptive equipment Lower Body Dressing Details (indicate cue type and reason): Cueing for strategies, pt able to use reacher to complete task with increased time Toilet Transfer: Min guard;Ambulation   Toileting- Clothing Manipulation and Hygiene: Supervision/safety;Sit to/from stand         General ADL Comments: Pt with improving coordination, strength and sensation of B hands though limitations still evident. Time spent discussing DME  use (BSC over toilet) and use of AE/compensatory strategies for ADLs     Vision Patient Visual Report: No change from baseline Vision Assessment?: No apparent visual deficits     Perception     Praxis       Pertinent Vitals/Pain Pain Assessment: Faces Faces Pain Scale: Hurts even more Pain Location: neck at incision site Pain Descriptors / Indicators: Grimacing;Guarding;Shooting Pain Intervention(s): Monitored during session;Limited activity within patient's tolerance;Patient requesting pain meds-RN notified     Hand Dominance Right   Extremity/Trunk Assessment Upper Extremity Assessment Upper Extremity Assessment: RUE deficits/detail;LUE deficits/detail RUE Deficits / Details: unable to fully extend digits, 3+/5 hand grasp, mild limitatations in wrist extension (hx of gout). Reports sensation improving though still some numbness and decreased coordination. RUE Sensation: decreased light touch RUE Coordination: decreased fine motor LUE Deficits / Details: 3+/5 grasp strength. This UE appears stronger than dominant hand. Reports sensation improving though still some numbness and decreased coordination. LUE Sensation: decreased light touch LUE Coordination: decreased fine motor   Lower Extremity Assessment Lower Extremity Assessment: Defer to PT evaluation   Cervical / Trunk Assessment Cervical / Trunk Assessment: Other exceptions (rounded shoulders)   Communication Communication Communication: No difficulties   Cognition Arousal/Alertness: Awake/alert Behavior During Therapy: WFL for tasks assessed/performed Overall Cognitive Status: Within Functional Limits for tasks assessed                                 General Comments: WFL cognitively, some decreased awareness of deficits and impact of precautions on daily tasks though receptive of education   General Comments  Provided squeeze balls, theraputty and UE HEP handouts to progress strength and coordination at home    Exercises     Shoulder Instructions      Home Living Family/patient expects to be discharged to:: Private residence Living Arrangements: Alone Available Help at Discharge:  Family;Friend(s);Available PRN/intermittently Type of Home: House Home Access: Ramped entrance     Home Layout: One level     Bathroom Shower/Tub: Producer, television/film/video: Standard Bathroom Accessibility: Yes   Home Equipment: Environmental consultant - 4 wheels;Shower seat - built in;Grab bars - tub/shower;Hand held shower head;Adaptive equipment;Electric scooter Adaptive Equipment: Reacher;Sock aid Additional Comments: brother lives next door      Prior Functioning/Environment Level of Independence: Independent with assistive device(s)        Comments: Reports frequent falls prior to admission. Had been using electric scooter in the home due to fall risk. Pt reports difficulty gripping ADL items and it would take 1 hour to get dressed, difficulty with all ADLs/IADLs though pt reports able to manage. Pt reports not leaving the home much, has food delivered though also reports using stand-up mower in recent week.        OT Problem List: Decreased strength;Decreased range of motion;Impaired balance (sitting and/or standing);Decreased activity tolerance;Decreased coordination;Decreased knowledge of precautions;Impaired sensation;Impaired UE functional use;Pain      OT Treatment/Interventions: Self-care/ADL training;Therapeutic exercise;DME and/or AE instruction;Therapeutic activities;Patient/family education;Balance training    OT Goals(Current goals can be found in the care plan section) Acute Rehab OT Goals Patient Stated Goal: decrease pain, decrease falls OT Goal Formulation: With patient Time For Goal Achievement: 02/03/21 Potential to Achieve Goals: Good  OT Frequency: Min 2X/week   Barriers to D/C:            Co-evaluation  AM-PAC OT "6 Clicks" Daily Activity     Outcome Measure Help from another person eating meals?: A Little Help from another person taking care of personal grooming?: A Little Help from another person toileting, which includes using  toliet, bedpan, or urinal?: A Little Help from another person bathing (including washing, rinsing, drying)?: A Little Help from another person to put on and taking off regular upper body clothing?: A Little Help from another person to put on and taking off regular lower body clothing?: A Little 6 Click Score: 18   End of Session Equipment Utilized During Treatment: Rolling walker;Cervical collar Nurse Communication: Mobility status;Patient requests pain meds  Activity Tolerance: Patient tolerated treatment well Patient left: in bed;with call bell/phone within reach  OT Visit Diagnosis: Unsteadiness on feet (R26.81);Other abnormalities of gait and mobility (R26.89);Repeated falls (R29.6);Muscle weakness (generalized) (M62.81);Pain Pain - part of body:  (neck)                Time: 1779-3903 OT Time Calculation (min): 35 min Charges:  OT General Charges $OT Visit: 1 Visit OT Evaluation $OT Eval Low Complexity: 1 Low OT Treatments $Self Care/Home Management : 8-22 mins  Bradd Canary, OTR/L Acute Rehab Services Office: [phone]   Lorre Munroe 01/20/2021, 8:41 AM

## 2021-01-21 NOTE — Op Note (Addendum)
NEUROSURGERY OPERATIVE NOTE   PREOP DIAGNOSIS: Cervical stenosis with myelopathy, C4-5, C5-6  POSTOP DIAGNOSIS: Same  PROCEDURE: 1. Corpectomy (>50%) at C6 for decompression of spinal cord and exiting nerve roots  2. Placement of intervertebral biomechanical device, Medtronic 24mm peek cage 3. Placement of anterior instrumentation consisting of interbody plate and screws spanning C5-C7 - Medtronic Zevo plate, 18HU screws 4. Use of morselized bone allograft  5. Arthrodesis C5-C7, anterior interbody technique  6. Use of intraoperative microscope  SURGEON: Dr. Lisbeth Renshaw, MD  ASSISTANT: Dr. Coletta Memos, MD  ANESTHESIA: General Endotracheal  EBL: 150cc  SPECIMENS: None  DRAINS: None  COMPLICATIONS: None immediate  CONDITION: Hemodynamically stable to PACU  HISTORY: [name] is a 50 y.o. initially seen as a referral from the emergency department in the outpatient neurosurgery clinic with approximately 6 weeks of difficulty walking.  His exam was consistent with myelopathy.  MRI of the cervical spine was obtained demonstrating extremely large disc herniations with severe stenosis and spinal cord compression at C5-6 and C6-7.  Patient was therefore urgently scheduled for cervical decompression.  The risks, benefits, and alternatives to surgery were reviewed in detail with the patient.  After all his questions were answered informed consent was obtained and witnessed.  PROCEDURE IN DETAIL: The patient was brought to the operating room and transferred to the operative table. After induction of general anesthesia, the patient was positioned on the operative table in the supine position with all pressure points meticulously padded. The skin of the neck was then prepped and draped in the usual sterile fashion.  After timeout was conducted, the skin was infiltrated with local anesthetic. Skin incision was then made sharply and Bovie electrocautery was used to dissect the  subcutaneous tissue until the platysma was identified. The platysma was then divided and undermined. The sternocleidomastoid muscle was then identified and, utilizing natural fascial planes in the neck, the prevertebral fascia was identified and the carotid sheath was retracted laterally and the trachea and esophagus retracted medially. Spinal needle was introduced into the C5-6 disc space and our location was confirmed with lateral fluoroscopy.  Bovie electrocautery was used to dissect in the subperiosteal plane and elevate the bilateral longus coli muscles. Table mounted retractors were then placed. At this point, the microscope was draped and brought into the field, and the remainder of the case was done under the microscope using microdissecting technique.  The C6-7 disc space was incised sharply and rongeurs were use to initially complete a discectomy. The high-speed drill was then used to complete discectomy until the posterior annulus was identified and removed and the posterior longitudinal ligament was identified. Using a nerve hook, the PLL was elevated, and Kerrison rongeurs were used to remove the posterior longitudinal ligament and the ventral thecal sac was identified.  Multiple large free disc fragments were removed from underneath the posterior longitudinal ligament.  The PLL did appear to be densely adherent to the dura and partially calcified primarily in the midline.  Despite this, I did feel that after the disc fragments were removed that there was reasonable decompression of the thecal sac.  The endplates were prepared with curettes.  Trials were used to select an 8 mm lordotic cage.  This was packed with morselized bone allograft and tapped in the place until flush with the anterior vertebral body.  A 17 mm plate was initially chosen and placed across the interspace and 15 mm screws were placed into C6 and C7.  Attention was then turned  to the C5-6 interspace.  Again, the superficial  discectomy was completed with rongeurs and a high-speed drill was used to complete the discectomy until the posterior longitudinal ligament was identified.  Osteophytes emanating from the C5 and C6 vertebral bodies were removed.  Similar to the previous level, the posterior longitudinal ligament did appear to be densely adherent to the dura in the midline.  I was able to decompress the thecal sac partially with Kerrison punches however I was not able to fully decompress the thecal sac due to extension of disc material primarily underneath the C6 vertebral body.  Multiple attempts were made to access this disc material taking care not to pressure on the thecal sac which were unsuccessful.  I therefore elected to proceed with C6 corpectomy as the decompression from simple discectomy was inadequate.  The high-speed drill was used to remove the entirety of the C6 vertebral body with a trap placed on the suction to collect bone autograft.  Once the posterior cortex of the body was drilled away and cracked, I immediately noted decompression of the thecal sac which took up a much more normal position.  Similar to the disc spaces, the posterior longitudinal ligament was noted to be partially calcified and densely adherent to the dura in the midline.  Having removed the lateral aspect of the vertebral bodies in the posterior longitudinal ligament, this portion of adherent ligament in the midline was not compressive any longer and I did not feel the need to try to dissected away from the dura.  At this point having completed the corpectomy with good decompression, Kerrison punches were used to undercut the C5 and C7 vertebral bodies in order to decompress any disc material superiorly or inferiorly.  At this point, calipers were used to select a 28 mm cage.  This was packed with bone autograft harvested during the corpectomy.  The C5 endplate was prepared with curettes.  The cage was then tapped into place until flush  with the anterior vertebral bodies.  The above anterior cervical plate was then placed across the interspace and secured in C5 and C7 with 15 mm screws.  Final lateral fluoroscopy was taken and confirmed good location of the implanted hardware and good cervical alignment.  At this point, after all counts were verified to be correct, meticulous hemostasis was secured using a combination of bipolar electrocautery and passive hemostatics. The platysma muscle was then closed using interrupted 3-0 Vicryl sutures, and the skin was closed with a interrupted subcuticular stitch. Sterile dressings were then applied and the drapes removed.  The patient tolerated the procedure well and was extubated in the room and taken to the postanesthesia care unit in stable condition.   Lisbeth Renshaw, MD [facility]

## 2022-06-01 IMAGING — MR MR LUMBAR SPINE W/O CM
5 series · 30 of 48 positions shown · non-contrast
Comparison: None.

CLINICAL DATA: Low back pain, cauda equina syndrome suspected.
Multiple recent falls. Numbness and tingling in the hands and legs.
Limping.

EXAM:
MRI LUMBAR SPINE WITHOUT CONTRAST
TECHNIQUE: Multiplanar, multisequence MR imaging of the lumbar spine was
performed. No intravenous contrast was administered.

[Series 5: T1 · sagittal · 4.0mm · 0.81mm/px · 6 of 18 slices shown (1 of 2)]
[im 1/18]
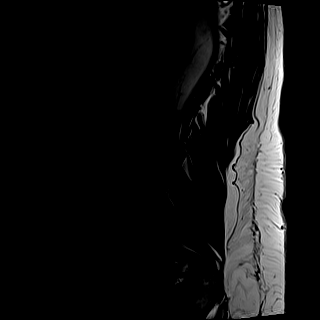
[im 4/18]
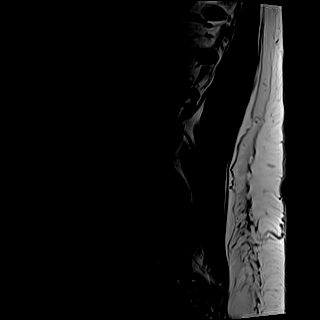
[im 7/18]
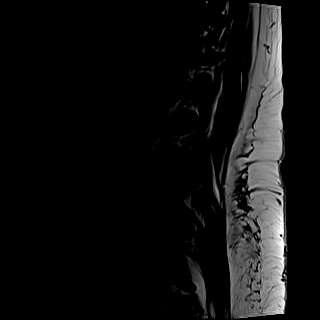
[im 11/18]
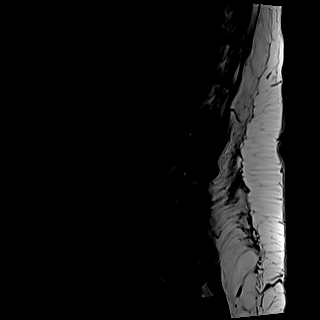
[im 14/18]
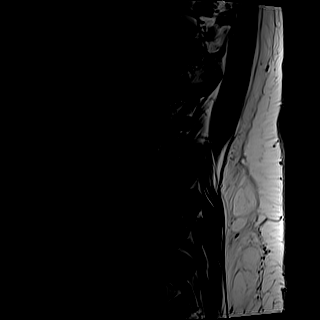
[im 18/18]
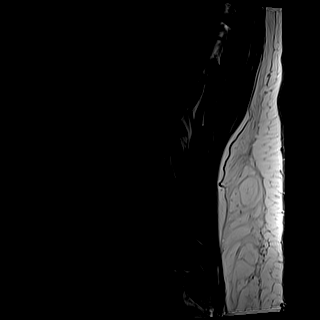

[Series 6: T2 · sagittal · 4.0mm · 0.81mm/px · 7 of 18 slices shown (1 of 2)]
[im 1/18]
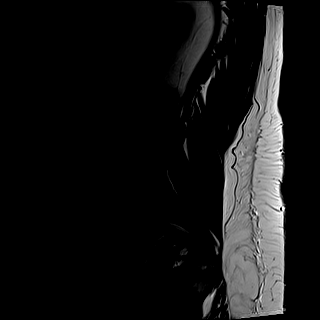
[im 3/18]
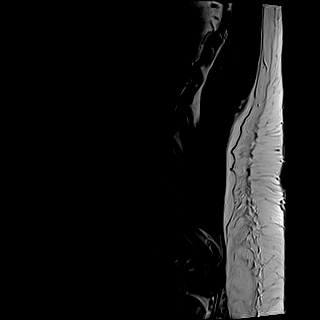
[im 6/18]
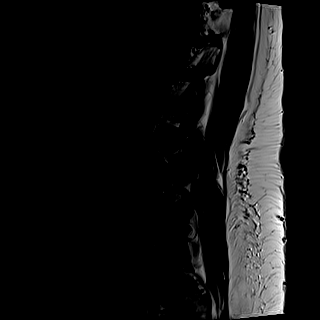
[im 9/18]
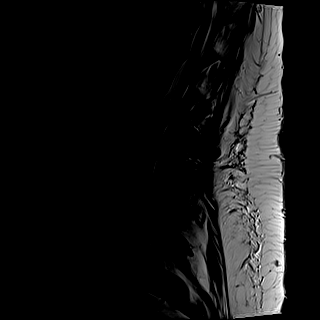
[im 12/18]
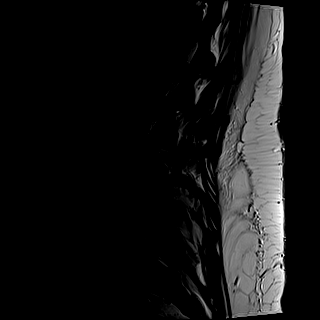
[im 15/18]
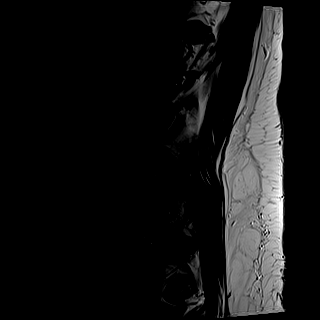
[im 18/18]
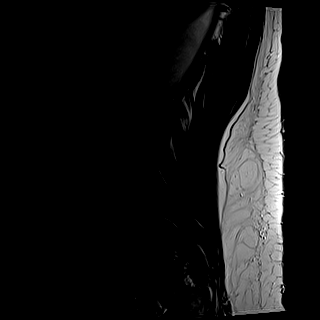

[Series 7: STIR · sagittal · 4.0mm · 0.51mm/px · 1 of 18 slices shown]
[im 1/18]
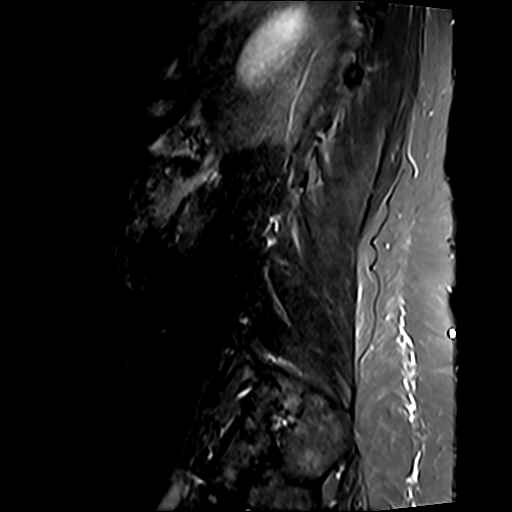

[Series 8: T2 · axial · 4.0mm · 0.69mm/px · z∈[-119,+118]mm · 8 of 39 slices shown (2 of 2)]
[im 1/39]
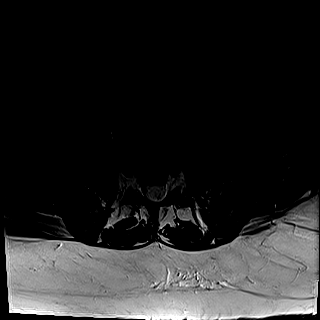
[im 6/39]
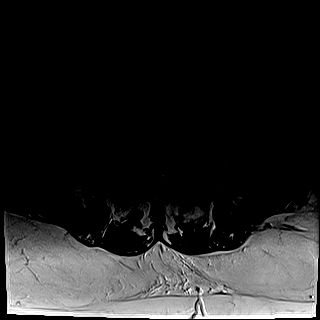
[im 12/39]
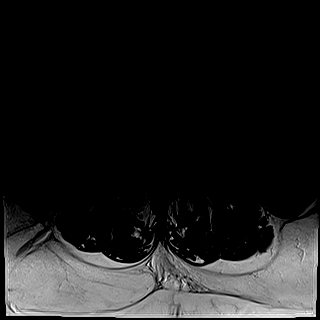
[im 18/39]
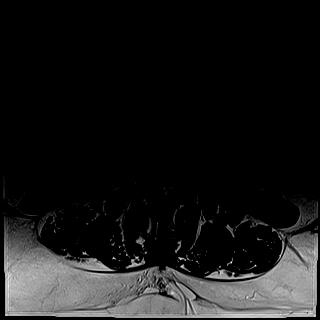
[im 21/39]
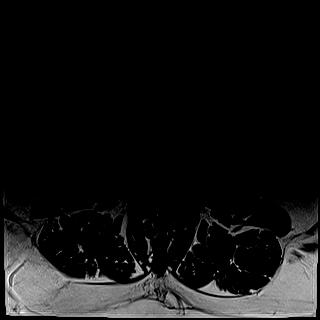
[im 27/39]
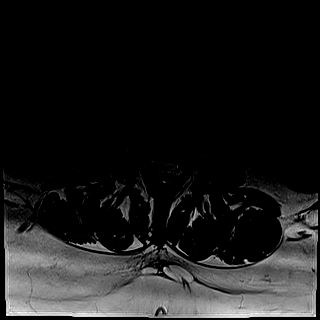
[im 33/39]
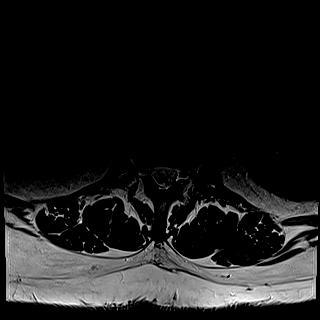
[im 39/39]
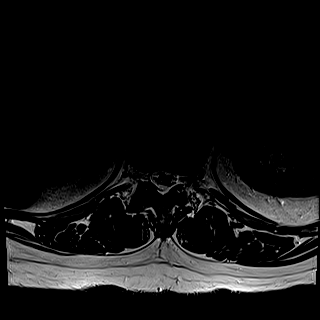

[Series 9: T1 · axial · 4.0mm · 0.43mm/px · z∈[-119,+118]mm · 8 of 39 slices shown (2 of 2)]
[im 1/39]
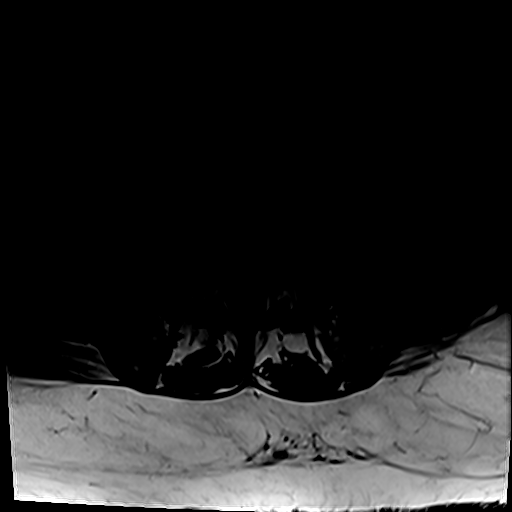
[im 6/39]
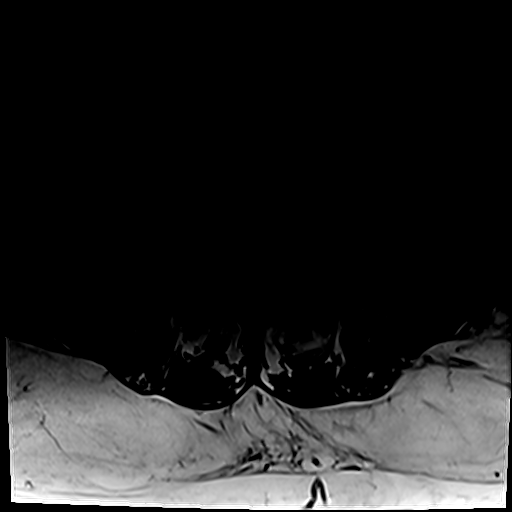
[im 12/39]
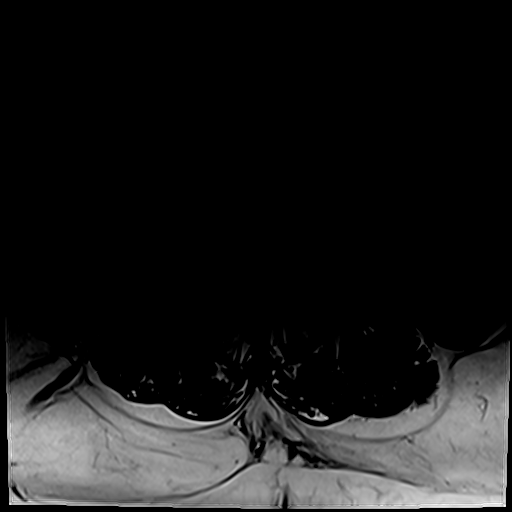
[im 18/39]
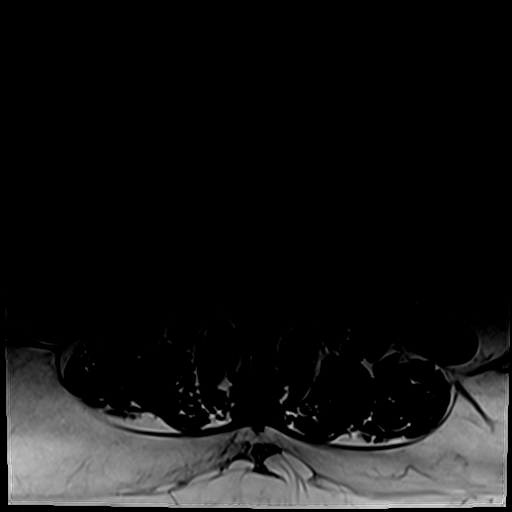
[im 21/39]
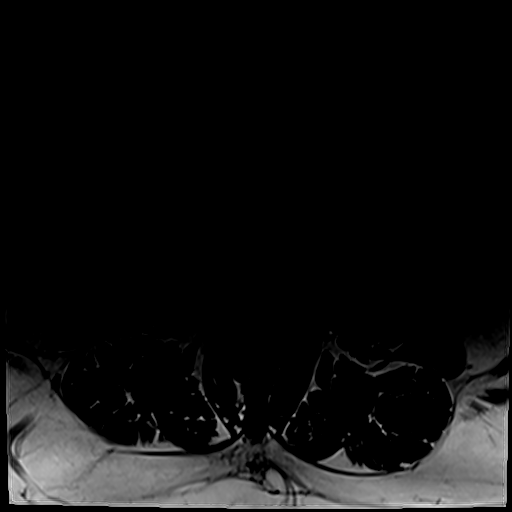
[im 27/39]
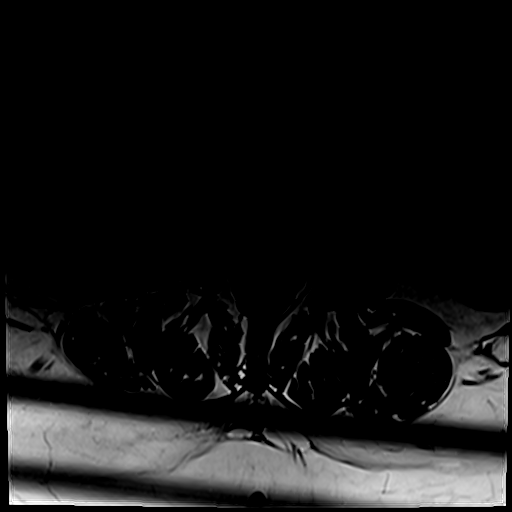
[im 33/39]
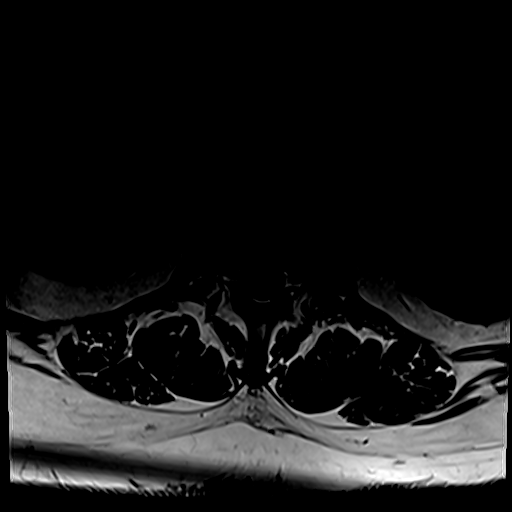
[im 39/39]
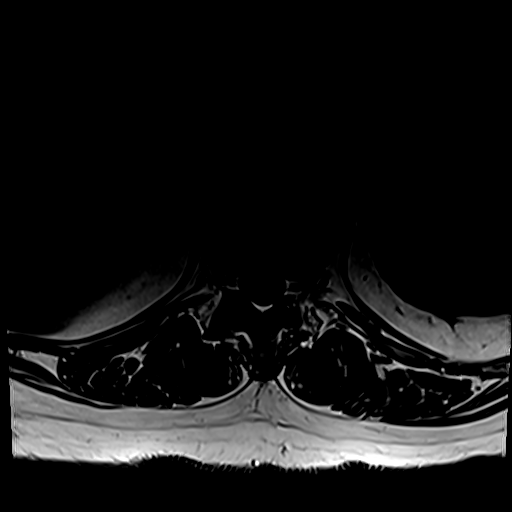

[30 of 48 positions shown; findings below may reference images not displayed]

FINDINGS: Segmentation: Normal lumbar segmentation is assumed with the lowest
fully formed disc space designated L5-S1.

Alignment:  Normal.

Vertebrae: No fracture or suspicious marrow lesion. Small Schmorl's
nodes in the lower thoracic spine. Mild left-sided endplate edema at
L2-3, likely degenerative. Mild bilateral L4-5 facet edema.

Conus medullaris and cauda equina: Conus extends to the L1-2 level.
Conus and cauda equina appear normal.

Paraspinal and other soft tissues: Mild edema in the soft tissues
about the L4-5 facets bilaterally.

Disc levels:

Disc desiccation greatest at T12-L1, L4-5, and L5-S1. Largely
preserved disc space heights.

T11-12: Only imaged sagittally. Small left paracentral disc
protrusion slightly deforming the spinal cord. Mild facet arthrosis.
No high-grade spinal canal or neural foraminal stenosis.

T12-L1: Shallow left paracentral disc protrusion and mild facet
arthrosis without stenosis.

L1-2: Negative.

L2-3: Mild disc bulging and moderate facet arthrosis without
stenosis.

L3-4: Disc bulging, congenitally short pedicles, and moderate facet
arthrosis result in borderline spinal stenosis, mild right lateral
recess stenosis, and mild bilateral neural foraminal stenosis.

L4-5: Circumferential disc bulging, congenitally short pedicles, and
severe facet and ligamentum flavum hypertrophy result in severe
spinal stenosis (5 mm AP spinal canal diameter) and moderate to
severe bilateral neural foraminal stenosis.

L5-S1: Minimal disc bulging and severe facet hypertrophy result in
mild bilateral lateral recess stenosis and mild right and moderate
left neural foraminal stenosis without spinal stenosis.
IMPRESSION: Multilevel lumbar disc and facet degeneration, worst at L4-5 where
there is severe spinal stenosis and moderate to severe bilateral
neural foraminal stenosis.
